# Patient Record
Sex: Female | Born: 1935 | Race: White | Hispanic: No | Smoking: Never smoker
Health system: Southern US, Community
[De-identification: ages and names within clinical notes are randomized; demographics above are authoritative.]

## PROBLEM LIST (undated history)

## (undated) DIAGNOSIS — E039 Hypothyroidism, unspecified: Secondary | ICD-10-CM

## (undated) DIAGNOSIS — E079 Disorder of thyroid, unspecified: Secondary | ICD-10-CM

## (undated) DIAGNOSIS — K5792 Diverticulitis of intestine, part unspecified, without perforation or abscess without bleeding: Secondary | ICD-10-CM

## (undated) DIAGNOSIS — I10 Essential (primary) hypertension: Secondary | ICD-10-CM

## (undated) DIAGNOSIS — K219 Gastro-esophageal reflux disease without esophagitis: Secondary | ICD-10-CM

## (undated) DIAGNOSIS — I639 Cerebral infarction, unspecified: Secondary | ICD-10-CM

## (undated) DIAGNOSIS — I509 Heart failure, unspecified: Secondary | ICD-10-CM

## (undated) DIAGNOSIS — I482 Chronic atrial fibrillation, unspecified: Secondary | ICD-10-CM

## (undated) HISTORY — PX: HIP FRACTURE SURGERY: SHX118

## (undated) HISTORY — PX: TONSILLECTOMY: SUR1361

## (undated) HISTORY — PX: APPENDECTOMY: SHX54

## (undated) HISTORY — PX: ABDOMINAL HYSTERECTOMY: SHX81

## (undated) HISTORY — PX: JOINT REPLACEMENT: SHX530

## (undated) HISTORY — PX: HERNIA REPAIR: SHX51

---

## 2011-04-10 ENCOUNTER — Emergency Department (HOSPITAL_BASED_OUTPATIENT_CLINIC_OR_DEPARTMENT_OTHER)
Admission: EM | Admit: 2011-04-10 | Discharge: 2011-04-10 | Disposition: A | Discharge status: Other Acute Inpatient Hospital | Payer: Medicare Other | Attending: Emergency Medicine | Admitting: Emergency Medicine

## 2011-04-10 ENCOUNTER — Emergency Department (INDEPENDENT_AMBULATORY_CARE_PROVIDER_SITE_OTHER): Payer: Medicare Other

## 2011-04-10 DIAGNOSIS — Z79899 Other long term (current) drug therapy: Secondary | ICD-10-CM | POA: Insufficient documentation

## 2011-04-10 DIAGNOSIS — K5730 Diverticulosis of large intestine without perforation or abscess without bleeding: Secondary | ICD-10-CM | POA: Insufficient documentation

## 2011-04-10 DIAGNOSIS — R109 Unspecified abdominal pain: Secondary | ICD-10-CM

## 2011-04-10 DIAGNOSIS — E039 Hypothyroidism, unspecified: Secondary | ICD-10-CM | POA: Insufficient documentation

## 2011-04-10 DIAGNOSIS — I10 Essential (primary) hypertension: Secondary | ICD-10-CM | POA: Insufficient documentation

## 2011-04-10 DIAGNOSIS — M47817 Spondylosis without myelopathy or radiculopathy, lumbosacral region: Secondary | ICD-10-CM

## 2011-04-10 DIAGNOSIS — M5137 Other intervertebral disc degeneration, lumbosacral region: Secondary | ICD-10-CM

## 2011-04-10 DIAGNOSIS — I4891 Unspecified atrial fibrillation: Secondary | ICD-10-CM | POA: Insufficient documentation

## 2011-04-10 LAB — URINALYSIS, ROUTINE W REFLEX MICROSCOPIC
Ketones, ur: NEGATIVE mg/dL
Nitrite: NEGATIVE
Protein, ur: NEGATIVE mg/dL
pH: 7.5 (ref 5.0–8.0)

## 2011-04-10 LAB — URINE MICROSCOPIC-ADD ON

## 2011-04-10 LAB — COMPREHENSIVE METABOLIC PANEL
CO2: 24 mEq/L (ref 19–32)
Calcium: 10.1 mg/dL (ref 8.4–10.5)
Creatinine, Ser: 1.3 mg/dL — ABNORMAL HIGH (ref 0.4–1.2)
GFR calc non Af Amer: 38 mL/min — ABNORMAL LOW (ref >60)
Glucose, Bld: 114 mg/dL — ABNORMAL HIGH (ref 70–99)

## 2011-04-10 LAB — POCT OCCULT BLOOD STOOL (DEVICE): Fecal Occult Bld: NEGATIVE

## 2011-04-10 LAB — DIFFERENTIAL
Eosinophils Absolute: 0.1 10*3/uL (ref 0.0–0.7)
Eosinophils Relative: 2 % (ref 0–5)
Lymphs Abs: 1.8 10*3/uL (ref 0.7–4.0)
Monocytes Relative: 11 % (ref 3–12)

## 2011-04-10 LAB — CBC
MCH: 31 pg (ref 26.0–34.0)
MCV: 92.7 fL (ref 78.0–100.0)
Platelets: 212 10*3/uL (ref 150–400)
RDW: 13.1 % (ref 11.5–15.5)

## 2011-04-10 MED ORDER — IOHEXOL 300 MG/ML  SOLN
100.0000 mL | Freq: Once | INTRAMUSCULAR | Status: AC | PRN
  Administered 2011-04-10: 100 mL via INTRAVENOUS

## 2012-04-09 ENCOUNTER — Emergency Department (HOSPITAL_BASED_OUTPATIENT_CLINIC_OR_DEPARTMENT_OTHER): Payer: Medicare Other

## 2012-04-09 ENCOUNTER — Emergency Department (HOSPITAL_BASED_OUTPATIENT_CLINIC_OR_DEPARTMENT_OTHER)
Admission: EM | Admit: 2012-04-09 | Discharge: 2012-04-09 | Disposition: A | Discharge status: Other Acute Inpatient Hospital | Payer: Medicare Other | Attending: Emergency Medicine | Admitting: Emergency Medicine

## 2012-04-09 ENCOUNTER — Encounter (HOSPITAL_BASED_OUTPATIENT_CLINIC_OR_DEPARTMENT_OTHER): Payer: Self-pay | Admitting: *Deleted

## 2012-04-09 DIAGNOSIS — E079 Disorder of thyroid, unspecified: Secondary | ICD-10-CM | POA: Insufficient documentation

## 2012-04-09 DIAGNOSIS — R1031 Right lower quadrant pain: Secondary | ICD-10-CM | POA: Insufficient documentation

## 2012-04-09 DIAGNOSIS — K219 Gastro-esophageal reflux disease without esophagitis: Secondary | ICD-10-CM | POA: Insufficient documentation

## 2012-04-09 DIAGNOSIS — I4891 Unspecified atrial fibrillation: Secondary | ICD-10-CM | POA: Insufficient documentation

## 2012-04-09 DIAGNOSIS — R110 Nausea: Secondary | ICD-10-CM | POA: Insufficient documentation

## 2012-04-09 DIAGNOSIS — I10 Essential (primary) hypertension: Secondary | ICD-10-CM | POA: Insufficient documentation

## 2012-04-09 DIAGNOSIS — I498 Other specified cardiac arrhythmias: Secondary | ICD-10-CM | POA: Insufficient documentation

## 2012-04-09 HISTORY — DX: Diverticulitis of intestine, part unspecified, without perforation or abscess without bleeding: K57.92

## 2012-04-09 HISTORY — DX: Gastro-esophageal reflux disease without esophagitis: K21.9

## 2012-04-09 HISTORY — DX: Essential (primary) hypertension: I10

## 2012-04-09 HISTORY — DX: Disorder of thyroid, unspecified: E07.9

## 2012-04-09 HISTORY — DX: Chronic atrial fibrillation, unspecified: I48.20

## 2012-04-09 LAB — COMPREHENSIVE METABOLIC PANEL
ALT: 16 U/L (ref 0–35)
AST: 21 U/L (ref 0–37)
Albumin: 3.8 g/dL (ref 3.5–5.2)
Alkaline Phosphatase: 76 U/L (ref 39–117)
BUN: 23 mg/dL (ref 6–23)
Chloride: 102 mEq/L (ref 96–112)
Potassium: 4.8 mEq/L (ref 3.5–5.1)
Sodium: 136 mEq/L (ref 135–145)
Total Bilirubin: 0.3 mg/dL (ref 0.3–1.2)

## 2012-04-09 LAB — CARDIAC PANEL(CRET KIN+CKTOT+MB+TROPI)
CK, MB: 3 ng/mL (ref 0.3–4.0)
Relative Index: INVALID (ref 0.0–2.5)
Total CK: 63 U/L (ref 7–177)
Troponin I: <0.3 ng/mL (ref <0.30)

## 2012-04-09 LAB — DIFFERENTIAL
Basophils Relative: 0 % (ref 0–1)
Monocytes Relative: 10 % (ref 3–12)
Neutro Abs: 6.3 10*3/uL (ref 1.7–7.7)
Neutrophils Relative %: 62 % (ref 43–77)

## 2012-04-09 LAB — CBC
Hemoglobin: 13 g/dL (ref 12.0–15.0)
MCHC: 34.3 g/dL (ref 30.0–36.0)
RBC: 4.05 MIL/uL (ref 3.87–5.11)

## 2012-04-09 LAB — URINALYSIS, ROUTINE W REFLEX MICROSCOPIC
Bilirubin Urine: NEGATIVE
Glucose, UA: NEGATIVE mg/dL
Hgb urine dipstick: NEGATIVE
Ketones, ur: NEGATIVE mg/dL
Protein, ur: NEGATIVE mg/dL

## 2012-04-09 MED ORDER — SODIUM CHLORIDE 0.9 % IV SOLN
INTRAVENOUS | Status: DC
  Administered 2012-04-09: 01:00:00 via INTRAVENOUS

## 2012-04-09 MED ORDER — IOHEXOL 300 MG/ML  SOLN
75.0000 mL | Freq: Once | INTRAMUSCULAR | Status: AC | PRN
  Administered 2012-04-09: 75 mL via INTRAVENOUS

## 2012-04-09 MED ORDER — ONDANSETRON HCL 4 MG/2ML IJ SOLN
INTRAMUSCULAR | Status: AC
  Filled 2012-04-09: qty 2

## 2012-04-09 MED ORDER — ONDANSETRON HCL 4 MG/2ML IJ SOLN
4.0000 mg | Freq: Once | INTRAMUSCULAR | Status: AC
  Administered 2012-04-09: 4 mg via INTRAVENOUS

## 2012-04-09 MED ORDER — SIMETHICONE 40 MG/0.6ML PO SUSP (UNIT DOSE)
80.0000 mg | Freq: Once | ORAL | Status: AC
  Administered 2012-04-09: 80 mg via ORAL
  Filled 2012-04-09: qty 1.2

## 2012-04-09 MED ORDER — SIMETHICONE 40 MG/0.6ML PO SUSP
ORAL | Status: AC
  Filled 2012-04-09: qty 1.2

## 2012-04-09 MED ORDER — ONDANSETRON HCL 4 MG/2ML IJ SOLN
INTRAMUSCULAR | Status: AC
  Administered 2012-04-09: 4 mg via INTRAVENOUS
  Filled 2012-04-09: qty 2

## 2012-04-09 MED ORDER — FENTANYL CITRATE 0.05 MG/ML IJ SOLN
50.0000 ug | Freq: Once | INTRAMUSCULAR | Status: AC
  Administered 2012-04-09: 50 ug via INTRAVENOUS
  Filled 2012-04-09: qty 2

## 2012-04-09 MED ORDER — FENTANYL CITRATE 0.05 MG/ML IJ SOLN
25.0000 ug | Freq: Once | INTRAMUSCULAR | Status: AC
  Administered 2012-04-09: 25 ug via INTRAVENOUS
  Filled 2012-04-09: qty 2

## 2012-04-09 MED ORDER — IOHEXOL 300 MG/ML  SOLN
20.0000 mL | INTRAMUSCULAR | Status: AC
  Administered 2012-04-09: 20 mL via ORAL

## 2012-04-09 MED ORDER — PROMETHAZINE HCL 25 MG/ML IJ SOLN
12.5000 mg | Freq: Once | INTRAMUSCULAR | Status: AC
  Administered 2012-04-09: 12.5 mg via INTRAVENOUS
  Filled 2012-04-09: qty 1

## 2012-04-09 NOTE — ED Notes (Signed)
Pt c/o returning pain. MD made aware, awaiting orders.

## 2012-04-09 NOTE — ED Notes (Signed)
MD at bedside. 

## 2012-04-09 NOTE — ED Provider Notes (Addendum)
History     CSN: [id number]  Arrival date & time 04/09/12  0041   First MD Initiated Contact with Patient 04/09/12 0122      Chief Complaint  Patient presents with  . Abdominal Pain    (Consider location/radiation/quality/duration/timing/severity/associated sxs/prior treatment) HPI This is a 78 year old white female with a history of diverticulitis. She had the sudden onset of right lower quadrant abdominal pain about 9 PM yesterday. The pain is moderate to severe and consistent with previous diverticulitis. It is less severe now than it was earlier. The pain is worse with movement or palpation. It has been associated with nausea but no vomiting. She has a history of chronic constipation with an acute change in her bowel habits. She's not aware of having any fever. She was given Zofran arrival with improvement in her nausea.  Past Medical History  Diagnosis Date  . Diverticulitis   . Atrial fibrillation, chronic   . Hypertension   . Thyroid disease   . GERD (gastroesophageal reflux disease)     Past Surgical History  Procedure Date  . Abdominal hysterectomy   . Tonsillectomy   . Appendectomy   . Joint replacement   . Hernia repair   . Cesarean section     History reviewed. No pertinent family history.  History  Substance Use Topics  . Smoking status: Never Smoker   . Smokeless tobacco: Not on file  . Alcohol Use: No    OB History    Grav Para Term Preterm Abortions TAB SAB Ect Mult Living                  Review of Systems  All other systems reviewed and are negative.    Allergies  Darvocet and Sulfa antibiotics  Home Medications  No current outpatient prescriptions on file.  BP 151/55  Pulse 62  Temp(Src) 97.7 F (36.5 C) (Oral)  Resp 16  Ht 5\' 1"  (1.549 m)  Wt 135 lb (61.236 kg)  BMI 25.51 kg/m2  SpO2 93%  LMP 04/09/2012  Physical Exam General: Well-developed, well-nourished female in no acute distress; appearance consistent with age of  record HENT: normocephalic, atraumatic Eyes: pupils equal round and reactive to light; extraocular muscles intact Neck: supple Heart: Irregular rhythm; distant sounds Lungs: clear to auscultation bilaterally Abdomen: soft; moderate to severe right upper quadrant tenderness; nondistended; bowel sounds present Extremities: No deformity; normal range of motion; +1 edema of lower leg Neurologic: Awake, alert and oriented; motor function intact in all extremities and symmetric; no facial droop Skin: Warm and dry    ED Course  Procedures (including critical care time)     MDM   Nursing notes and vitals signs, including pulse oximetry, reviewed.  Summary of this visit's results, reviewed by myself:  Labs:  Results for orders placed during the hospital encounter of 04/09/12  CBC      Component Value Range   WBC 10.3  4.0 - 10.5 (K/uL)   RBC 4.05  3.87 - 5.11 (MIL/uL)   Hemoglobin 13.0  12.0 - 15.0 (g/dL)   HCT 40.9  81.1 - 91.4 (%)   MCV 93.6  78.0 - 100.0 (fL)   MCH 32.1  26.0 - 34.0 (pg)   MCHC 34.3  30.0 - 36.0 (g/dL)   RDW 78.2  95.6 - 21.3 (%)   Platelets 197  150 - 400 (K/uL)  DIFFERENTIAL      Component Value Range   Neutrophils Relative 62  43 - 77 (%)  Neutro Abs 6.3  1.7 - 7.7 (K/uL)   Lymphocytes Relative 27  12 - 46 (%)   Lymphs Abs 2.7  0.7 - 4.0 (K/uL)   Monocytes Relative 10  3 - 12 (%)   Monocytes Absolute 1.0  0.1 - 1.0 (K/uL)   Eosinophils Relative 2  0 - 5 (%)   Eosinophils Absolute 0.2  0.0 - 0.7 (K/uL)   Basophils Relative 0  0 - 1 (%)   Basophils Absolute 0.0  0.0 - 0.1 (K/uL)  COMPREHENSIVE METABOLIC PANEL      Component Value Range   Sodium 136  135 - 145 (mEq/L)   Potassium 4.8  3.5 - 5.1 (mEq/L)   Chloride 102  96 - 112 (mEq/L)   CO2 27  19 - 32 (mEq/L)   Glucose, Bld 127 (*) 70 - 99 (mg/dL)   BUN 23  6 - 23 (mg/dL)   Creatinine, Ser 1.61 (*) 0.50 - 1.10 (mg/dL)   Calcium 09.6 (*) 8.4 - 10.5 (mg/dL)   Total Protein 7.1  6.0 - 8.3 (g/dL)     Albumin 3.8  3.5 - 5.2 (g/dL)   AST 21  0 - 37 (U/L)   ALT 16  0 - 35 (U/L)   Alkaline Phosphatase 76  39 - 117 (U/L)   Total Bilirubin 0.3  0.3 - 1.2 (mg/dL)   GFR calc non Af Amer 34 (*) >90 (mL/min)   GFR calc Af Amer 39 (*) >90 (mL/min)  URINALYSIS, ROUTINE W REFLEX MICROSCOPIC      Component Value Range   Color, Urine YELLOW  YELLOW    APPearance CLEAR  CLEAR    Specific Gravity, Urine 1.011  1.005 - 1.030    pH 7.5  5.0 - 8.0    Glucose, UA NEGATIVE  NEGATIVE (mg/dL)   Hgb urine dipstick NEGATIVE  NEGATIVE    Bilirubin Urine NEGATIVE  NEGATIVE    Ketones, ur NEGATIVE  NEGATIVE (mg/dL)   Protein, ur NEGATIVE  NEGATIVE (mg/dL)   Urobilinogen, UA 0.2  0.0 - 1.0 (mg/dL)   Nitrite NEGATIVE  NEGATIVE    Leukocytes, UA NEGATIVE  NEGATIVE   LACTIC ACID, PLASMA      Component Value Range   Lactic Acid, Venous 0.6  0.5 - 2.2 (mmol/L)  LIPASE, BLOOD      Component Value Range   Lipase 20  11 - 59 (U/L)  CARDIAC PANEL(CRET KIN+CKTOT+MB+TROPI)      Component Value Range   Total CK 63  7 - 177 (U/L)   CK, MB 3.0  0.3 - 4.0 (ng/mL)   Troponin I <0.30  <0.30 (ng/mL)   Relative Index RELATIVE INDEX IS INVALID  0.0 - 2.5     Imaging Studies: Ct Abdomen Pelvis W Contrast  04/09/2012  *RADIOLOGY REPORT*  Clinical Data: Abdominal pain, nausea and vomiting.  White cell count 10.3.  CT ABDOMEN AND PELVIS WITH CONTRAST  Technique:  Multidetector CT imaging of the abdomen and pelvis was performed following the standard protocol during bolus administration of intravenous contrast.  Contrast: 75mL OMNIPAQUE IOHEXOL 300 MG/ML  SOLN .  Reduced contrast dose given due to GFR 35.  Comparison: 04/10/2011  Findings: Fibrosis and atelectasis in the lung bases.  Coronary artery calcification.  Postoperative changes with cholecystectomy and sutures or mesh in the right anterior abdominal wall.  Intra and extrahepatic bile duct dilatation, similar to previous study, and likely due to postoperative  physiology.  The pancreas is atrophic.  No adrenal gland  nodules.  Cyst in the left kidney is stable.  No hydronephrosis in either kidney.  Mild renal parenchymal atrophy with symmetrical nephrograms.  Extensive calcification of the abdominal aorta and branch vessels.  Flow is demonstrated in the mesenteric and portal vessels.  The spleen size is normal.  No retroperitoneal lymphadenopathy.  No free fluid or free air in the abdomen.  Small anterior abdominal wall hernia along the midline and containing fat.  The gastric wall is not thickened.  Small bowel are decompressed.  Stool filled colon without distension.  Pelvis:  There is a right inguinal hernia containing what appears to be ascending colon and terminal ileum.  The appendix is not visualized.  No bladder wall thickening.  Uterus and adnexal structures are not visualized and may be surgically absent or atrophic.  Large amount of stool in the rectum and sigmoid colon. Diverticulosis of the sigmoid colon.  No evidence of diverticulitis.  Mild mesenteric edema is suggested.  Small left inguinal hernia containing fluid.  Spondylolysis and spondylolisthesis of L4 on L5.  Mild anterior subluxation of L3 on L4 is likely degenerative.  Degenerative changes throughout the lumbar spine.  Compression deformities of T11 and T12 vertebra. Bone changes are stable since the previous study.  IMPRESSION: Right inguinal hernia containing colon without obstruction. Diffusely stool filled colon suggesting constipation.  No acute inflammatory process.  Original Report Authenticated By: Marlon Pel, M.D.   3:26 AM Patient sleeping comfortably. The patient still has pain and tenderness in the right upper quadrant. There is no tenderness in the right inguinal region.  4:00 AM Patient had large bowel movements and still complaining of pain.  6:05 AM Patient states she is in too much pain to get out of bed. We will have her admitted to [facility].  Patient was accepted by Dr. Pearson Grippe.         Hanley Seamen, MD 04/09/12 [id number]  Hanley Seamen, MD 04/09/12 [phone]

## 2012-04-09 NOTE — ED Notes (Signed)
Pt c/o abd pain x 3 hrs hx diverticulitis

## 2012-04-09 NOTE — ED Notes (Signed)
Pt is finished with oral contrast 

## 2012-04-09 NOTE — ED Notes (Signed)
Report received from charge nurse Vickie. Pt care assumed, pt first seen by this rn.

## 2012-04-09 NOTE — ED Notes (Signed)
Patient used bedpan; very small loose BM.

## 2013-08-13 ENCOUNTER — Emergency Department (HOSPITAL_BASED_OUTPATIENT_CLINIC_OR_DEPARTMENT_OTHER): Payer: Medicare Other

## 2013-08-13 ENCOUNTER — Emergency Department (HOSPITAL_BASED_OUTPATIENT_CLINIC_OR_DEPARTMENT_OTHER)
Admission: EM | Admit: 2013-08-13 | Discharge: 2013-08-13 | Disposition: A | Discharge status: Home / Self Care | Payer: Medicare Other | Attending: Emergency Medicine | Admitting: Emergency Medicine

## 2013-08-13 ENCOUNTER — Encounter (HOSPITAL_BASED_OUTPATIENT_CLINIC_OR_DEPARTMENT_OTHER): Payer: Self-pay | Admitting: Student

## 2013-08-13 DIAGNOSIS — R42 Dizziness and giddiness: Secondary | ICD-10-CM

## 2013-08-13 DIAGNOSIS — I10 Essential (primary) hypertension: Secondary | ICD-10-CM | POA: Insufficient documentation

## 2013-08-13 DIAGNOSIS — K219 Gastro-esophageal reflux disease without esophagitis: Secondary | ICD-10-CM | POA: Insufficient documentation

## 2013-08-13 DIAGNOSIS — Z7901 Long term (current) use of anticoagulants: Secondary | ICD-10-CM | POA: Insufficient documentation

## 2013-08-13 DIAGNOSIS — Z79899 Other long term (current) drug therapy: Secondary | ICD-10-CM | POA: Insufficient documentation

## 2013-08-13 DIAGNOSIS — R918 Other nonspecific abnormal finding of lung field: Secondary | ICD-10-CM

## 2013-08-13 DIAGNOSIS — E079 Disorder of thyroid, unspecified: Secondary | ICD-10-CM | POA: Insufficient documentation

## 2013-08-13 DIAGNOSIS — R222 Localized swelling, mass and lump, trunk: Secondary | ICD-10-CM | POA: Insufficient documentation

## 2013-08-13 LAB — URINALYSIS, ROUTINE W REFLEX MICROSCOPIC
Bilirubin Urine: NEGATIVE
Ketones, ur: NEGATIVE mg/dL
Leukocytes, UA: NEGATIVE
Nitrite: NEGATIVE
Urobilinogen, UA: 0.2 mg/dL (ref 0.0–1.0)
pH: 7.5 (ref 5.0–8.0)

## 2013-08-13 LAB — BASIC METABOLIC PANEL
BUN: 33 mg/dL — ABNORMAL HIGH (ref 6–23)
Calcium: 11.4 mg/dL — ABNORMAL HIGH (ref 8.4–10.5)
Creatinine, Ser: 1.2 mg/dL — ABNORMAL HIGH (ref 0.50–1.10)
GFR calc Af Amer: 43 mL/min — ABNORMAL LOW (ref >90)
GFR calc non Af Amer: 37 mL/min — ABNORMAL LOW (ref >90)

## 2013-08-13 LAB — TROPONIN I: Troponin I: <0.3 ng/mL (ref <0.30)

## 2013-08-13 LAB — CBC
HCT: 35.4 % — ABNORMAL LOW (ref 36.0–46.0)
MCH: 31.4 pg (ref 26.0–34.0)
MCHC: 32.8 g/dL (ref 30.0–36.0)
MCV: 95.9 fL (ref 78.0–100.0)
RDW: 12.6 % (ref 11.5–15.5)

## 2013-08-13 MED ORDER — SODIUM CHLORIDE 0.9 % IV BOLUS (SEPSIS)
500.0000 mL | Freq: Once | INTRAVENOUS | Status: AC
  Administered 2013-08-13: 500 mL via INTRAVENOUS

## 2013-08-13 NOTE — ED Provider Notes (Signed)
CSN: [id number]     Arrival date & time 08/13/13  5784 History   First MD Initiated Contact with Patient 08/13/13 0945     Chief Complaint  Patient presents with  . Atrial Fibrillation   (Consider location/radiation/quality/duration/timing/severity/associated sxs/prior Treatment) HPI Comments: Dizziness intermittently since yesterday. Can't describe quality dizziness. Denies CP, SOB, fever, vomiting. Better with lying down, worse with standing up. States hx of chronic balance problems and ambulate with a walker intermittently. No hx of stroke, TIA. Patient's daughter states she got up, walked around all morning and did not seem off balance.  Patient is a 79 y.o. female presenting with atrial fibrillation and neurologic complaint. The history is provided by the patient.  Atrial Fibrillation This is a chronic problem. Pertinent negatives include no chest pain, no abdominal pain and no shortness of breath.  Neurologic Problem This is a new problem. The current episode started yesterday. The problem occurs daily. The problem has been gradually improving. Pertinent negatives include no chest pain, no abdominal pain and no shortness of breath. Exacerbated by: standing up. Relieved by: lying down. She has tried nothing for the symptoms. The treatment provided no relief.    Past Medical History  Diagnosis Date  . Diverticulitis   . Atrial fibrillation, chronic   . Hypertension   . Thyroid disease   . GERD (gastroesophageal reflux disease)    Past Surgical History  Procedure Laterality Date  . Abdominal hysterectomy    . Tonsillectomy    . Appendectomy    . Joint replacement    . Hernia repair    . Cesarean section     History reviewed. No pertinent family history. History  Substance Use Topics  . Smoking status: Never Smoker   . Smokeless tobacco: Not on file  . Alcohol Use: No   OB History   Grav Para Term Preterm Abortions TAB SAB Ect Mult Living                 Review of  Systems  Constitutional: Negative for fever and chills.  Eyes: Negative for visual disturbance.  Respiratory: Positive for cough (occasional). Negative for shortness of breath.   Cardiovascular: Negative for chest pain and leg swelling.  Gastrointestinal: Negative for vomiting, abdominal pain and diarrhea.  Genitourinary: Negative for dysuria, frequency and flank pain.  Musculoskeletal: Positive for gait problem (chronic problems with her balance).  All other systems reviewed and are negative.    Allergies  Darvocet and Sulfa antibiotics  Home Medications   Current Outpatient Rx  Name  Route  Sig  Dispense  Refill  . acetaminophen (TYLENOL) 500 MG tablet   Oral   Take 500 mg by mouth every 6 (six) hours as needed for pain.         . carvedilol (COREG) 12.5 MG tablet   Oral   Take 12.5 mg by mouth 2 (two) times daily with a meal.         . furosemide (LASIX) 40 MG tablet   Oral   Take 40 mg by mouth.         . levothyroxine (SYNTHROID, LEVOTHROID) 75 MCG tablet   Oral   Take 75 mcg by mouth daily before breakfast.         . losartan (COZAAR) 50 MG tablet   Oral   Take 50 mg by mouth daily.         Marland Kitchen omeprazole (PRILOSEC) 20 MG capsule   Oral   Take 20 mg by  mouth daily.         . Rivaroxaban (XARELTO) 15 MG TABS tablet   Oral   Take 15 mg by mouth 2 (two) times daily with a meal.          BP 151/89  Pulse 62  Temp(Src) 97.8 F (36.6 C) (Oral)  Resp 18  Wt 122 lb 8 oz (55.566 kg)  BMI 23.16 kg/m2  SpO2 97%  LMP 04/09/2012 Physical Exam  Nursing note and vitals reviewed. Constitutional: She is oriented to person, place, and time. She appears well-developed and well-nourished. No distress.  HENT:  Head: Normocephalic and atraumatic.  Eyes: EOM are normal. Pupils are equal, round, and reactive to light.  Neck: Normal range of motion. Neck supple.  Cardiovascular: Normal rate and regular rhythm.  Exam reveals no friction rub.   No murmur  heard. Pulmonary/Chest: Effort normal and breath sounds normal. No respiratory distress. She has no wheezes. She has no rales.  Abdominal: Soft. She exhibits no distension. There is no tenderness. There is no rebound.  Musculoskeletal: Normal range of motion. She exhibits no edema.  Neurological: She is alert and oriented to person, place, and time. No cranial nerve deficit. She exhibits normal muscle tone. Coordination normal.  Normal gait  Skin: No rash noted. She is not diaphoretic.    ED Course  Procedures (including critical care time) Labs Review Labs Reviewed - No data to display Imaging Review No results found.   Date: 08/13/2013  Rate: 64  Rhythm: atrial fibrillation  QRS Axis: normal  Intervals: normal  ST/T Wave abnormalities: normal  Conduction Disutrbances:none  Narrative Interpretation:   Old EKG Reviewed: prior EKG with sinus rhythm and 1st degree AV block.   MDM   1. Dizziness   2. Lung mass    66 are old female with history of chronic A. fib on xarelto, diverticulitis, hypertension presents with mild dizziness. She states an intermittent dizziness when she stands up to move around since yesterday. She states is improving. She had called her doctor this morning to ask if she could get her blood pressure checked, because she thought her dizziness was due to her elevated blood pressure, and that her doctor told her to get checked out in the ER after she mentioned an intermittent history of arm pain. And arm pain comes once every 2-3 weeks, lasts just a few seconds and starts in her left arm. She denies any chest pain, shortness of breath, fevers, abdominal pain, nausea, vomiting, diarrhea, dysuria. She states an occasional mild cough. She is a little distressed because she just wanted to get her blood pressure checked, and now she's here in the emergency department. As far as her dizziness, she states it is improving. She states she has history of balance trouble and  asthma she uses a walker. She feels like she needs use a walker to help not to have her lose her balance. She states she's had vertigo previously, and this does not feel like vertigo. On exam today, her vitals are stable. She is well-appearing and relaxing comfortably in no acute distress. She is not dizzy here. She has normal cranial nerve exam, normal coordination, normal strength and sensation in all extremities. She has normal gait. She got out of bed unassisted and walk without any dizziness. Upon standing, she did not have any dizziness. Her initial EKG shows A. fib, which she has a chronic history of. I noted no other EKG showing A. fib, however A. fib as listed  in her chart as chronic and she is on chronic anticoagulation. Patient appears her well and does not appear to be in any acute distress. I do not feel her dizziness is related to a vertiginous cause. I feel like it is likely orthostatic with that coming upon sAndtanding. She cannot describe it as swimming versus spinning, however I feel like it is likely due to orthostasis. She reports decreased by mouth intake chronically due to not cooking as much, but no acute decrease in her by mouth intake. I will check basic labs, urine studies, CXR, Head CT.  Labs normal. 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 No signs of acute infarct. Patient states bouts of a long time, this in part could benefit years. Patient's chest x-ray shows left lung mass. Overlies the right ventricle. An informed patient about these findings. 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 I do not feel that this is unreasonable. I instructed her to followup with her PCP for her lung mass in her stroke workup. She is currently anticoagulated consult so, so do not feel any discern aspirin. I spoke with the PA at her primary doctor's practice and informed her of these findings and she will contact the patient next week. Patient stable for discharge.  Dagmar Hait, MD 08/13/13 [phone]

## 2013-08-13 NOTE — ED Notes (Signed)
MD at bedside. 

## 2013-08-13 NOTE — ED Notes (Signed)
Pt in with AFIB, reports being checked out by EMS at scene and referred to hospital. Pt has HX of AFIB. Pt reports being dizzy since yesterday afternoon. Denies N V D CP LOC SOB.

## 2013-12-06 ENCOUNTER — Emergency Department (HOSPITAL_BASED_OUTPATIENT_CLINIC_OR_DEPARTMENT_OTHER)
Admission: EM | Admit: 2013-12-06 | Discharge: 2013-12-06 | Disposition: A | Discharge status: Home / Self Care | Payer: Medicare Other | Attending: Emergency Medicine | Admitting: Emergency Medicine

## 2013-12-06 ENCOUNTER — Emergency Department (HOSPITAL_BASED_OUTPATIENT_CLINIC_OR_DEPARTMENT_OTHER): Payer: Medicare Other

## 2013-12-06 ENCOUNTER — Encounter (HOSPITAL_BASED_OUTPATIENT_CLINIC_OR_DEPARTMENT_OTHER): Payer: Self-pay | Admitting: Emergency Medicine

## 2013-12-06 DIAGNOSIS — I4891 Unspecified atrial fibrillation: Secondary | ICD-10-CM | POA: Insufficient documentation

## 2013-12-06 DIAGNOSIS — Z7901 Long term (current) use of anticoagulants: Secondary | ICD-10-CM | POA: Insufficient documentation

## 2013-12-06 DIAGNOSIS — E039 Hypothyroidism, unspecified: Secondary | ICD-10-CM | POA: Insufficient documentation

## 2013-12-06 DIAGNOSIS — K219 Gastro-esophageal reflux disease without esophagitis: Secondary | ICD-10-CM | POA: Insufficient documentation

## 2013-12-06 DIAGNOSIS — M79606 Pain in leg, unspecified: Secondary | ICD-10-CM

## 2013-12-06 DIAGNOSIS — M79609 Pain in unspecified limb: Secondary | ICD-10-CM | POA: Insufficient documentation

## 2013-12-06 DIAGNOSIS — Z79899 Other long term (current) drug therapy: Secondary | ICD-10-CM | POA: Insufficient documentation

## 2013-12-06 DIAGNOSIS — I10 Essential (primary) hypertension: Secondary | ICD-10-CM | POA: Insufficient documentation

## 2013-12-06 DIAGNOSIS — M7989 Other specified soft tissue disorders: Secondary | ICD-10-CM | POA: Insufficient documentation

## 2013-12-06 HISTORY — DX: Hypothyroidism, unspecified: E03.9

## 2013-12-06 NOTE — ED Provider Notes (Signed)
CSN: [id number]     Arrival date & time 12/06/13  4098 History   First MD Initiated Contact with Patient 12/06/13 [phone]     Chief Complaint  Patient presents with  . Leg Pain    left lower leg   (Consider location/radiation/quality/duration/timing/severity/associated sxs/prior Treatment) HPI Comments: Patient presents with left leg pain. She has a history of atrial fibrillation and has been off her several 2 for 3 days because she's having a tooth pulled today. She started having some intermittent pains in her left leg last night, it was little worse this morning. She noticed some puffiness to her leg earlier today but the leg is back to normal now. She denies any chest pain or shortness of breath. She denies any history of DVT in the past.  Patient is a 80 y.o. female presenting with leg pain.  Leg Pain Associated symptoms: no back pain, no fatigue and no fever     Past Medical History  Diagnosis Date  . Diverticulitis   . Atrial fibrillation, chronic   . Hypertension   . Thyroid disease   . GERD (gastroesophageal reflux disease)   . Hypothyroidism    Past Surgical History  Procedure Laterality Date  . Abdominal hysterectomy    . Tonsillectomy    . Appendectomy    . Joint replacement    . Hernia repair    . Cesarean section     History reviewed. No pertinent family history. History  Substance Use Topics  . Smoking status: Never Smoker   . Smokeless tobacco: Not on file  . Alcohol Use: No   OB History   Grav Para Term Preterm Abortions TAB SAB Ect Mult Living                 Review of Systems  Constitutional: Negative for fever, chills, diaphoresis and fatigue.  HENT: Negative for congestion, rhinorrhea and sneezing.   Eyes: Negative.   Respiratory: Negative for cough, chest tightness and shortness of breath.   Cardiovascular: Positive for leg swelling. Negative for chest pain.  Gastrointestinal: Negative for nausea, vomiting, abdominal pain, diarrhea and blood in  stool.  Genitourinary: Negative for frequency, hematuria, flank pain and difficulty urinating.  Musculoskeletal: Positive for myalgias. Negative for arthralgias and back pain.  Skin: Negative for rash.  Neurological: Negative for dizziness, speech difficulty, weakness, numbness and headaches.    Allergies  Darvocet and Sulfa antibiotics  Home Medications   Current Outpatient Rx  Name  Route  Sig  Dispense  Refill  . acetaminophen (TYLENOL) 500 MG tablet   Oral   Take 500 mg by mouth every 6 (six) hours as needed for pain.         . carvedilol (COREG) 12.5 MG tablet   Oral   Take 12.5 mg by mouth 2 (two) times daily with a meal.         . furosemide (LASIX) 40 MG tablet   Oral   Take 40 mg by mouth.         . levothyroxine (SYNTHROID, LEVOTHROID) 75 MCG tablet   Oral   Take 75 mcg by mouth daily before breakfast.         . losartan (COZAAR) 50 MG tablet   Oral   Take 50 mg by mouth daily.         Marland Kitchen omeprazole (PRILOSEC) 20 MG capsule   Oral   Take 20 mg by mouth daily.         . Rivaroxaban (  XARELTO) 15 MG TABS tablet   Oral   Take 15 mg by mouth 2 (two) times daily with a meal.          BP 155/49  Pulse 53  Temp(Src) 97.7 F (36.5 C)  Resp 16  Ht 5\' 1"  (1.549 m)  Wt 120 lb (54.432 kg)  BMI 22.69 kg/m2  SpO2 98%  LMP 04/09/2012 Physical Exam  Constitutional: She is oriented to person, place, and time. She appears well-developed and well-nourished.  HENT:  Head: Normocephalic and atraumatic.  Eyes: Pupils are equal, round, and reactive to light.  Neck: Normal range of motion. Neck supple.  Cardiovascular: Normal rate and normal heart sounds.  An irregular rhythm present.  Pulmonary/Chest: Effort normal and breath sounds normal. No respiratory distress. She has no wheezes. She has no rales. She exhibits no tenderness.  Abdominal: Soft. Bowel sounds are normal. There is no tenderness. There is no rebound and no guarding.  Musculoskeletal: Normal  range of motion. She exhibits no edema.  Patient has some tenderness to the medial aspect of her left lower leg distally. There is no pain to the posterior calf. There is no appreciable edema in the left as compared to the right. She has normal color in sensation to light. I cannot palpate either dorsalis pedis pulse. Using a Doppler ultrasound, she has strong dopplerable posterior tibial pulses bilaterally. Unable to Doppler either dorsalis pedis pulse. However she does have good color and warmth to both feet with no signs of acute vascular compromise.  Lymphadenopathy:    She has no cervical adenopathy.  Neurological: She is alert and oriented to person, place, and time.  Skin: Skin is warm and dry. No rash noted.  Psychiatric: She has a normal mood and affect.    ED Course  Procedures (including critical care time) Labs Review Labs Reviewed - No data to display Imaging Review [place] Venous Img Lower Unilateral Left  12/06/2013   CLINICAL DATA:  Left leg pain and swelling.  EXAM: LEFT LOWER EXTREMITY VENOUS DOPPLER ULTRASOUND  TECHNIQUE: Gray-scale sonography with graded compression, as well as color Doppler and duplex ultrasound, were performed to evaluate the deep venous system from the level of the common femoral vein through the popliteal and proximal calf veins. Spectral Doppler was utilized to evaluate flow at rest and with distal augmentation maneuvers.  COMPARISON:  None.  FINDINGS: Thrombus within deep veins:  None visualized.  Compressibility of deep veins:  Normal.  Duplex waveform respiratory phasicity:  Normal.  Duplex waveform response to augmentation:  Normal.  Venous reflux:  None visualized.  Other findings:  None visualized.  IMPRESSION: No deep venous thrombosis of the left lower extremity.   Electronically Signed   By: Elige KoHetal  Patel   On: 12/06/2013 11:39    EKG Interpretation   None       MDM   1. Leg pain    Patient has no evidence of DVT. She has no clinical findings  suggestive of acute arterial insufficiency. She's point tender in one area of her leg and I feel this is likely musculoskeletal in nature. I did encourage her to followup with her primary care physician for reevaluation and if she has any claudication symptoms may need to see a vascular surgeon.    Rolan BuccoMelanie Anaria Kroner, MD 12/06/13 [phone]

## 2013-12-06 NOTE — ED Notes (Signed)
Pt in with c/o left lower leg pain at left upper inner ankle area x several day. Reports being off Xarelto x 3 days r/t dental appt today. Reports increased pain with elevation of leg and while leg is at rest.

## 2013-12-06 NOTE — ED Notes (Signed)
MD at bedside. 

## 2013-12-06 NOTE — Discharge Instructions (Signed)
Musculoskeletal Pain °Musculoskeletal pain is muscle and boney aches and pains. These pains can occur in any part of the body. Your caregiver may treat you without knowing the cause of the pain. They may treat you if blood or urine tests, X-rays, and other tests were normal.  °CAUSES °There is often not a definite cause or reason for these pains. These pains may be caused by a type of germ (virus). The discomfort may also come from overuse. Overuse includes working out too hard when your body is not fit. Boney aches also come from weather changes. Bone is sensitive to atmospheric pressure changes. °HOME CARE INSTRUCTIONS  °· Ask when your test results will be ready. Make sure you get your test results. °· Only take over-the-counter or prescription medicines for pain, discomfort, or fever as directed by your caregiver. If you were given medications for your condition, do not drive, operate machinery or power tools, or sign legal documents for 24 hours. Do not drink alcohol. Do not take sleeping pills or other medications that may interfere with treatment. °· Continue all activities unless the activities cause more pain. When the pain lessens, slowly resume normal activities. Gradually increase the intensity and duration of the activities or exercise. °· During periods of severe pain, bed rest may be helpful. Lay or sit in any position that is comfortable. °· Putting ice on the injured area. °· Put ice in a bag. °· Place a towel between your skin and the bag. °· Leave the ice on for 15 to 20 minutes, 3 to 4 times a day. °· Follow up with your caregiver for continued problems and no reason can be found for the pain. If the pain becomes worse or does not go away, it may be necessary to repeat tests or do additional testing. Your caregiver may need to look further for a possible cause. °SEEK IMMEDIATE MEDICAL CARE IF: °· You have pain that is getting worse and is not relieved by medications. °· You develop chest pain  that is associated with shortness or breath, sweating, feeling sick to your stomach (nauseous), or throw up (vomit). °· Your pain becomes localized to the abdomen. °· You develop any new symptoms that seem different or that concern you. °MAKE SURE YOU:  °· Understand these instructions. °· Will watch your condition. °· Will get help right away if you are not doing well or get worse. °Document Released: 10/28/2005 Document Revised: 01/20/2012 Document Reviewed: 07/02/2013 °ExitCare® Patient Information ©2014 [employer]. ° °

## 2014-08-08 ENCOUNTER — Emergency Department (HOSPITAL_BASED_OUTPATIENT_CLINIC_OR_DEPARTMENT_OTHER)
Admission: EM | Admit: 2014-08-08 | Discharge: 2014-08-08 | Disposition: A | Discharge status: Other Acute Inpatient Hospital | Payer: Medicare Other | Attending: Emergency Medicine | Admitting: Emergency Medicine

## 2014-08-08 ENCOUNTER — Emergency Department (HOSPITAL_BASED_OUTPATIENT_CLINIC_OR_DEPARTMENT_OTHER): Payer: Medicare Other

## 2014-08-08 ENCOUNTER — Encounter (HOSPITAL_BASED_OUTPATIENT_CLINIC_OR_DEPARTMENT_OTHER): Payer: Self-pay | Admitting: Emergency Medicine

## 2014-08-08 DIAGNOSIS — R059 Cough, unspecified: Secondary | ICD-10-CM | POA: Insufficient documentation

## 2014-08-08 DIAGNOSIS — E079 Disorder of thyroid, unspecified: Secondary | ICD-10-CM | POA: Diagnosis not present

## 2014-08-08 DIAGNOSIS — K219 Gastro-esophageal reflux disease without esophagitis: Secondary | ICD-10-CM | POA: Insufficient documentation

## 2014-08-08 DIAGNOSIS — F17200 Nicotine dependence, unspecified, uncomplicated: Secondary | ICD-10-CM | POA: Diagnosis not present

## 2014-08-08 DIAGNOSIS — I509 Heart failure, unspecified: Secondary | ICD-10-CM | POA: Diagnosis not present

## 2014-08-08 DIAGNOSIS — Z7901 Long term (current) use of anticoagulants: Secondary | ICD-10-CM | POA: Insufficient documentation

## 2014-08-08 DIAGNOSIS — I4891 Unspecified atrial fibrillation: Secondary | ICD-10-CM | POA: Diagnosis not present

## 2014-08-08 DIAGNOSIS — I10 Essential (primary) hypertension: Secondary | ICD-10-CM | POA: Diagnosis not present

## 2014-08-08 DIAGNOSIS — R05 Cough: Secondary | ICD-10-CM | POA: Insufficient documentation

## 2014-08-08 DIAGNOSIS — Z79899 Other long term (current) drug therapy: Secondary | ICD-10-CM | POA: Diagnosis not present

## 2014-08-08 LAB — CBC
HCT: 37.2 % (ref 36.0–46.0)
HEMOGLOBIN: 12.1 g/dL (ref 12.0–15.0)
MCH: 31.8 pg (ref 26.0–34.0)
MCHC: 32.5 g/dL (ref 30.0–36.0)
MCV: 97.9 fL (ref 78.0–100.0)
PLATELETS: 209 10*3/uL (ref 150–400)
RBC: 3.8 MIL/uL — AB (ref 3.87–5.11)
RDW: 13 % (ref 11.5–15.5)
WBC: 6.7 10*3/uL (ref 4.0–10.5)

## 2014-08-08 LAB — TROPONIN I: Troponin I: <0.3 ng/mL (ref <0.30)

## 2014-08-08 LAB — BASIC METABOLIC PANEL
ANION GAP: 12 (ref 5–15)
BUN: 19 mg/dL (ref 6–23)
CALCIUM: 11.2 mg/dL — AB (ref 8.4–10.5)
CO2: 29 meq/L (ref 19–32)
Chloride: 99 mEq/L (ref 96–112)
Creatinine, Ser: 1 mg/dL (ref 0.50–1.10)
GFR calc Af Amer: 53 mL/min — ABNORMAL LOW (ref >90)
GFR, EST NON AFRICAN AMERICAN: 46 mL/min — AB (ref >90)
GLUCOSE: 95 mg/dL (ref 70–99)
Potassium: 4.2 mEq/L (ref 3.7–5.3)
Sodium: 140 mEq/L (ref 137–147)

## 2014-08-08 LAB — PRO B NATRIURETIC PEPTIDE: PRO B NATRI PEPTIDE: 4943 pg/mL — AB (ref 0–450)

## 2014-08-08 MED ORDER — ALBUTEROL SULFATE HFA 108 (90 BASE) MCG/ACT IN AERS
2.0000 | INHALATION_SPRAY | RESPIRATORY_TRACT | Status: DC | PRN
  Administered 2014-08-08: 2 via RESPIRATORY_TRACT
  Filled 2014-08-08: qty 6.7

## 2014-08-08 MED ORDER — FUROSEMIDE 10 MG/ML IJ SOLN
40.0000 mg | Freq: Once | INTRAMUSCULAR | Status: AC
  Administered 2014-08-08: 40 mg via INTRAVENOUS
  Filled 2014-08-08: qty 4

## 2014-08-08 NOTE — ED Notes (Signed)
[facility] is coming to transport patient to [facility] to [place], truck has been dispatched

## 2014-08-08 NOTE — ED Notes (Signed)
Patient transported to X-ray 

## 2014-08-08 NOTE — ED Notes (Signed)
Cough x6 days.  ABX  x5 days without office visit.  No better so called pmd office again this morning.  Sent here for eval.

## 2014-08-08 NOTE — ED Notes (Signed)
Pt leaving facility with [facility] at this time.

## 2014-08-08 NOTE — ED Notes (Signed)
MD at bedside. 

## 2014-08-08 NOTE — ED Provider Notes (Signed)
CSN: [id number]     Arrival date & time 08/08/14  5621 History   First MD Initiated Contact with Patient 08/08/14 [phone]     Chief Complaint  Patient presents with  . Cough     (Consider location/radiation/quality/duration/timing/severity/associated sxs/prior Treatment) HPI Pt presenting with c/o cough.  She states symptoms have been ongoing for the past 7 days.  She finished a course of azithromycin called in over the phone by her doctor, last dose last night.  No fever/chills.  No chest pain.  Was up all last night coughing and is tired this morning.  No change in her baseline lower extremity swelling.  No vomiting or diarrhea.  Cough is productive.  She has continued to eat and drink normallly.  There are no other associated systemic symptoms, there are no other alleviating or modifying factors.   Past Medical History  Diagnosis Date  . Diverticulitis   . Atrial fibrillation, chronic   . Hypertension   . Thyroid disease   . GERD (gastroesophageal reflux disease)   . Hypothyroidism    Past Surgical History  Procedure Laterality Date  . Abdominal hysterectomy    . Tonsillectomy    . Appendectomy    . Joint replacement    . Hernia repair    . Cesarean section    . Hip fracture surgery     No family history on file. History  Substance Use Topics  . Smoking status: Never Smoker   . Smokeless tobacco: Not on file  . Alcohol Use: No   OB History   Grav Para Term Preterm Abortions TAB SAB Ect Mult Living                 Review of Systems ROS reviewed and all otherwise negative except for mentioned in HPI    Allergies  Darvocet and Sulfa antibiotics  Home Medications   Prior to Admission medications   Medication Sig Start Date End Date Taking? Authorizing Provider  acetaminophen (TYLENOL) 500 MG tablet Take 500 mg by mouth every 6 (six) hours as needed for pain.    Historical Provider, MD  carvedilol (COREG) 12.5 MG tablet Take 12.5 mg by mouth 2 (two) times daily  with a meal.    Historical Provider, MD  furosemide (LASIX) 40 MG tablet Take 40 mg by mouth.    Historical Provider, MD  levothyroxine (SYNTHROID, LEVOTHROID) 75 MCG tablet Take 75 mcg by mouth daily before breakfast.    Historical Provider, MD  losartan (COZAAR) 50 MG tablet Take 50 mg by mouth daily.    Historical Provider, MD  omeprazole (PRILOSEC) 20 MG capsule Take 20 mg by mouth daily.    Historical Provider, MD  Rivaroxaban (XARELTO) 15 MG TABS tablet Take 15 mg by mouth 2 (two) times daily with a meal.    Historical Provider, MD   BP 164/56  Pulse 92  Temp(Src) 98.4 F (36.9 C) (Oral)  Resp 22  Wt 120 lb (54.432 kg)  SpO2 92%  LMP 04/09/2012 Vitals reviewed Physical Exam Physical Examination: General appearance - alert, well appearing, and in no distress Mental status - alert, oriented to person, place, and time Eyes -no conjunctival injection, no scleral icterus Mouth - mucous membranes moist, pharynx normal without lesions Chest - clear to auscultation, no wheezes, rales or rhonchi, symmetric air entry, normal respiratory effort Heart - normal rate, regular rhythm, normal S1, S2, no murmurs, rubs, clicks or gallops Abdomen - soft, nontender, nondistended, no masses or organomegaly Extremities -  peripheral pulses normal, no pedal edema, no clubbing or cyanosis Skin - normal coloration and turgor, no rashes  ED Course  Procedures (including critical care time)  9:43 AM d/w Dr. Macky [facility]  Pt will go to a telemetry bed.   Labs Review Labs Reviewed  CBC - Abnormal; Notable for the following:    RBC 3.80 (*)    All other components within normal limits  BASIC METABOLIC PANEL - Abnormal; Notable for the following:    Calcium 11.2 (*)    GFR calc non Af Amer 46 (*)    GFR calc Af Amer 53 (*)    All other components within normal limits  PRO B NATRIURETIC PEPTIDE - Abnormal; Notable for the following:    Pro B Natriuretic peptide (BNP) 4943.0 (*)     All other components within normal limits  TROPONIN I    Imaging Review Dg Chest 2 View  08/08/2014   CLINICAL DATA:  Productive cough and congestion and cold symptoms; history of atrial fibrillation and hypertension  EXAM: CHEST  2 VIEW  COMPARISON:  PA and lateral chest of June 30, 2014  FINDINGS: The lungs are hyperinflated. There is no focal infiltrate. The cardiac silhouette is enlarged. The central pulmonary vascularity is mildly prominent. There is mild tortuosity of the descending thoracic aorta. There is no significant pleural effusion. There is no pneumothorax. The bony thorax exhibits no acute abnormality.  IMPRESSION: COPD and likely low-grade CHF. There is no alveolar pneumonia. One cannot exclude acute bronchitis in the appropriate clinical setting.   Electronically Signed   By: David  [place]   On: 08/08/2014 07:57     EKG Interpretation   Date/Time:  Monday August 08 2014 07:58:42 EDT Ventricular Rate:  76 PR Interval:    QRS Duration: 108 QT Interval:  394 QTC Calculation: 443 R Axis:   74 Text Interpretation:  Atrial fibrillation with premature ventricular or  aberrantly conducted complexes Septal infarct , age undetermined Abnormal  ECG Since previous tracing PVCs are new Confirmed by [facility]  MD, MARTHA  [phone]) on 08/08/2014 9:04:14 AM      MDM   Final diagnoses:  CHF exacerbation    Pt presenting with c/o productive cough, and leg swelling.  Has completed course of zithromax yesterday without improvement.  CXR shows CHF, BNP 4000, pt given IV lasix in the ED and transferred to [facility] for further treatment.  Pt is agreeable with the plan for admission.      Ethelda Chick, MD 08/08/14 [phone]

## 2015-06-01 DIAGNOSIS — R1084 Generalized abdominal pain: Secondary | ICD-10-CM | POA: Insufficient documentation

## 2015-06-01 DIAGNOSIS — K219 Gastro-esophageal reflux disease without esophagitis: Secondary | ICD-10-CM | POA: Diagnosis not present

## 2015-06-01 DIAGNOSIS — I499 Cardiac arrhythmia, unspecified: Secondary | ICD-10-CM | POA: Diagnosis not present

## 2015-06-01 DIAGNOSIS — E039 Hypothyroidism, unspecified: Secondary | ICD-10-CM | POA: Diagnosis not present

## 2015-06-01 DIAGNOSIS — Z79899 Other long term (current) drug therapy: Secondary | ICD-10-CM | POA: Diagnosis not present

## 2015-06-01 DIAGNOSIS — Z8673 Personal history of transient ischemic attack (TIA), and cerebral infarction without residual deficits: Secondary | ICD-10-CM | POA: Insufficient documentation

## 2015-06-01 DIAGNOSIS — I10 Essential (primary) hypertension: Secondary | ICD-10-CM | POA: Insufficient documentation

## 2015-06-02 ENCOUNTER — Emergency Department (HOSPITAL_BASED_OUTPATIENT_CLINIC_OR_DEPARTMENT_OTHER): Payer: Medicare (Managed Care)

## 2015-06-02 ENCOUNTER — Emergency Department (HOSPITAL_BASED_OUTPATIENT_CLINIC_OR_DEPARTMENT_OTHER)
Admission: EM | Admit: 2015-06-02 | Discharge: 2015-06-02 | Disposition: A | Discharge status: Home / Self Care | Payer: Medicare (Managed Care) | Attending: Emergency Medicine | Admitting: Emergency Medicine

## 2015-06-02 ENCOUNTER — Encounter (HOSPITAL_BASED_OUTPATIENT_CLINIC_OR_DEPARTMENT_OTHER): Payer: Self-pay | Admitting: Emergency Medicine

## 2015-06-02 DIAGNOSIS — R109 Unspecified abdominal pain: Secondary | ICD-10-CM

## 2015-06-02 HISTORY — DX: Cerebral infarction, unspecified: I63.9

## 2015-06-02 LAB — COMPREHENSIVE METABOLIC PANEL
ALT: 17 U/L (ref 14–54)
AST: 27 U/L (ref 15–41)
Albumin: 3.9 g/dL (ref 3.5–5.0)
Alkaline Phosphatase: 83 U/L (ref 38–126)
Anion gap: 5 (ref 5–15)
BUN: 23 mg/dL — AB (ref 6–20)
CALCIUM: 10.5 mg/dL — AB (ref 8.9–10.3)
CO2: 29 mmol/L (ref 22–32)
Chloride: 102 mmol/L (ref 101–111)
Creatinine, Ser: 1.03 mg/dL — ABNORMAL HIGH (ref 0.44–1.00)
GFR, EST AFRICAN AMERICAN: 51 mL/min — AB (ref >60)
GFR, EST NON AFRICAN AMERICAN: 44 mL/min — AB (ref >60)
GLUCOSE: 112 mg/dL — AB (ref 65–99)
POTASSIUM: 4.7 mmol/L (ref 3.5–5.1)
Sodium: 136 mmol/L (ref 135–145)
Total Bilirubin: 1.2 mg/dL (ref 0.3–1.2)
Total Protein: 7 g/dL (ref 6.5–8.1)

## 2015-06-02 LAB — CBC WITH DIFFERENTIAL/PLATELET
BASOS PCT: 0 % (ref 0–1)
Basophils Absolute: 0 10*3/uL (ref 0.0–0.1)
EOS PCT: 1 % (ref 0–5)
Eosinophils Absolute: 0.1 10*3/uL (ref 0.0–0.7)
HCT: 38.2 % (ref 36.0–46.0)
HEMOGLOBIN: 12.3 g/dL (ref 12.0–15.0)
Lymphocytes Relative: 10 % — ABNORMAL LOW (ref 12–46)
Lymphs Abs: 0.9 10*3/uL (ref 0.7–4.0)
MCH: 31.6 pg (ref 26.0–34.0)
MCHC: 32.2 g/dL (ref 30.0–36.0)
MCV: 98.2 fL (ref 78.0–100.0)
Monocytes Absolute: 0.9 10*3/uL (ref 0.1–1.0)
Monocytes Relative: 9 % (ref 3–12)
NEUTROS PCT: 80 % — AB (ref 43–77)
Neutro Abs: 7.8 10*3/uL — ABNORMAL HIGH (ref 1.7–7.7)
Platelets: 159 10*3/uL (ref 150–400)
RBC: 3.89 MIL/uL (ref 3.87–5.11)
RDW: 12.4 % (ref 11.5–15.5)
WBC: 9.6 10*3/uL (ref 4.0–10.5)

## 2015-06-02 LAB — URINALYSIS, ROUTINE W REFLEX MICROSCOPIC
BILIRUBIN URINE: NEGATIVE
Glucose, UA: NEGATIVE mg/dL
Hgb urine dipstick: NEGATIVE
Ketones, ur: NEGATIVE mg/dL
NITRITE: NEGATIVE
Protein, ur: NEGATIVE mg/dL
Specific Gravity, Urine: 1.014 (ref 1.005–1.030)
UROBILINOGEN UA: 1 mg/dL (ref 0.0–1.0)
pH: 7.5 (ref 5.0–8.0)

## 2015-06-02 LAB — URINE MICROSCOPIC-ADD ON

## 2015-06-02 LAB — TROPONIN I: Troponin I: 0.03 ng/mL (ref <0.031)

## 2015-06-02 LAB — BRAIN NATRIURETIC PEPTIDE: B Natriuretic Peptide: 558.7 pg/mL — ABNORMAL HIGH (ref 0.0–100.0)

## 2015-06-02 MED ORDER — GI COCKTAIL ~~LOC~~: 30.0000 mL | Freq: Once | ORAL | Status: DC

## 2015-06-02 NOTE — ED Provider Notes (Signed)
CSN: [id number]     Arrival date & time 06/01/15  2359 History   First MD Initiated Contact with Patient 06/02/15 0014     Chief Complaint  Patient presents with  . Abdominal Pain     (Consider location/radiation/quality/duration/timing/severity/associated sxs/prior Treatment) Patient is a 81 y.o. female presenting with abdominal pain. The history is provided by the patient.  Abdominal Pain Pain location:  Generalized Pain quality: aching   Pain radiates to:  Does not radiate Pain severity:  Mild Onset quality:  Gradual Timing:  Intermittent Progression:  Unchanged Chronicity:  New Context: not alcohol use   Relieved by:  Nothing Worsened by:  Nothing tried Ineffective treatments:  None tried Associated symptoms: no anorexia, no chest pain, no cough and no fever   Risk factors: being elderly   Risk factors: no alcohol abuse     Past Medical History  Diagnosis Date  . Diverticulitis   . Atrial fibrillation, chronic   . Hypertension   . Thyroid disease   . GERD (gastroesophageal reflux disease)   . Hypothyroidism   . Stroke    Past Surgical History  Procedure Laterality Date  . Abdominal hysterectomy    . Tonsillectomy    . Appendectomy    . Joint replacement    . Hernia repair    . Cesarean section    . Hip fracture surgery     History reviewed. No pertinent family history. History  Substance Use Topics  . Smoking status: Never Smoker   . Smokeless tobacco: Not on file  . Alcohol Use: No   OB History    No data available     Review of Systems  Constitutional: Negative for fever.  Respiratory: Negative for cough.   Cardiovascular: Negative for chest pain, palpitations and leg swelling.  Gastrointestinal: Positive for abdominal pain. Negative for anorexia.  All other systems reviewed and are negative.     Allergies  Darvocet and Sulfa antibiotics  Home Medications   Prior to Admission medications   Medication Sig Start Date End Date Taking?  Authorizing Provider  atorvastatin (LIPITOR) 10 MG tablet Take 10 mg by mouth daily.   Yes Historical Provider, MD  acetaminophen (TYLENOL) 500 MG tablet Take 500 mg by mouth every 6 (six) hours as needed for pain.    Historical Provider, MD  carvedilol (COREG) 12.5 MG tablet Take 12.5 mg by mouth 2 (two) times daily with a meal.    Historical Provider, MD  furosemide (LASIX) 40 MG tablet Take 40 mg by mouth.    Historical Provider, MD  levothyroxine (SYNTHROID, LEVOTHROID) 75 MCG tablet Take 75 mcg by mouth daily before breakfast.    Historical Provider, MD  losartan (COZAAR) 50 MG tablet Take 50 mg by mouth daily.    Historical Provider, MD  omeprazole (PRILOSEC) 20 MG capsule Take 20 mg by mouth daily.    Historical Provider, MD  Rivaroxaban (XARELTO) 15 MG TABS tablet Take 15 mg by mouth 2 (two) times daily with a meal.    Historical Provider, MD   BP 151/63 mmHg  Pulse 65  Temp(Src) 98.4 F (36.9 C) (Oral)  Resp 20  Ht 5\' 2"  (1.575 m)  Wt 117 lb (53.071 kg)  BMI 21.39 kg/m2  SpO2 97%  LMP 04/09/2012 Physical Exam  Constitutional: She is oriented to person, place, and time. She appears well-developed and well-nourished. No distress.  HENT:  Head: Normocephalic and atraumatic.  Mouth/Throat: Oropharynx is clear and moist.  Eyes: Conjunctivae are normal.  Pupils are equal, round, and reactive to light.  Neck: Normal range of motion. Neck supple.  Cardiovascular: Normal rate.  An irregularly irregular rhythm present.  Pulmonary/Chest: Effort normal and breath sounds normal. No respiratory distress. She has no wheezes. She has no rales. She exhibits no tenderness.  Abdominal: Soft. She exhibits no distension. Bowel sounds are increased. There is no tenderness. There is no rigidity, no rebound, no guarding, no tenderness at McBurney's point and negative Murphy's sign.  Musculoskeletal: Normal range of motion.  Neurological: She is alert and oriented to person, place, and time.  Skin:  Skin is warm and dry.  Psychiatric: She has a normal mood and affect.    ED Course  Procedures (including critical care time) Labs Review Labs Reviewed  CBC WITH DIFFERENTIAL/PLATELET - Abnormal; Notable for the following:    Neutrophils Relative % 80 (*)    Neutro Abs 7.8 (*)    Lymphocytes Relative 10 (*)    All other components within normal limits  COMPREHENSIVE METABOLIC PANEL - Abnormal; Notable for the following:    Glucose, Bld 112 (*)    BUN 23 (*)    Creatinine, Ser 1.03 (*)    Calcium 10.5 (*)    GFR calc non Af Amer 44 (*)    GFR calc Af Amer 51 (*)    All other components within normal limits  URINALYSIS, ROUTINE W REFLEX MICROSCOPIC (NOT AT [facility]) - Abnormal; Notable for the following:    Leukocytes, UA TRACE (*)    All other components within normal limits  BRAIN NATRIURETIC PEPTIDE - Abnormal; Notable for the following:    B Natriuretic Peptide 558.7 (*)    All other components within normal limits  URINE MICROSCOPIC-ADD ON - Abnormal; Notable for the following:    Squamous Epithelial / LPF FEW (*)    Bacteria, UA FEW (*)    All other components within normal limits  URINE CULTURE  TROPONIN I    Imaging Review Dg Abd Acute W/chest  06/02/2015   CLINICAL DATA:  Acute onset abdominal pain, slight nausea, shortness of breath and increased urinary frequency. Initial encounter.  EXAM: DG ABDOMEN ACUTE W/ 1V CHEST  COMPARISON:  Chest radiograph from 12/16/2014, and abdominal radiograph performed 10/04/2014  FINDINGS: The lungs are well-aerated. Minimal bibasilar atelectasis is noted. There is no evidence of focal opacification, pleural effusion or pneumothorax. The cardiomediastinal silhouette is mildly enlarged.  The visualized bowel gas pattern is unremarkable. Scattered stool and air are seen within the colon; there is no evidence of small bowel dilatation to suggest obstruction. No free intra-abdominal air is identified on the provided upright view. Postoperative  change is noted at the right upper quadrant.  No acute osseous abnormalities are seen; the sacroiliac joints are unremarkable in appearance. Mild degenerative change is noted along the lumbar spine, and at the sacroiliac joints. A left femoral intramedullary rod and screw are only partially imaged, but appear grossly unremarkable. Degenerative change is noted at the pubic symphysis.  IMPRESSION: 1. Unremarkable bowel gas pattern; no free intra-abdominal air seen. Moderate amount of stool noted in the colon. 2. Minimal bibasilar atelectasis noted; lungs otherwise clear. Mild cardiomegaly.   Electronically Signed   By: Roanna Raider M.D.   On: 06/02/2015 03:02     EKG Interpretation   Date/Time:  Friday June 02 2015 00:39:54 EDT Ventricular Rate:  80 PR Interval:    QRS Duration: 106 QT Interval:  388 QTC Calculation: 447 R Axis:   29  Text Interpretation:  Atrial fibrillation with premature ventricular or  aberrantly conducted complexes Confirmed by [facility]  MD, Maleaha Hughett  ([id number]) on 06/02/2015 2:00:33 AM      MDM   Final diagnoses:  None    Well appearing.  Patient later admitted she felt fine, she just wanted a check up to make sure she was fine because she gets anxious at the age of 24.  All imaging and labs are normal.  Patient is well appearing and safe for discharge at this time    Kennieth Plotts, MD 06/02/15 6045

## 2015-06-02 NOTE — ED Notes (Signed)
Pt having mid abd pain, slight nausea, SOB, and urinary frequency since yesterday. Pt reports sx as mild.

## 2015-07-03 ENCOUNTER — Emergency Department (HOSPITAL_BASED_OUTPATIENT_CLINIC_OR_DEPARTMENT_OTHER): Payer: Medicare Other

## 2015-07-03 ENCOUNTER — Encounter (HOSPITAL_BASED_OUTPATIENT_CLINIC_OR_DEPARTMENT_OTHER): Payer: Self-pay | Admitting: Emergency Medicine

## 2015-07-03 ENCOUNTER — Emergency Department (HOSPITAL_BASED_OUTPATIENT_CLINIC_OR_DEPARTMENT_OTHER)
Admission: EM | Admit: 2015-07-03 | Discharge: 2015-07-03 | Disposition: A | Discharge status: Home / Self Care | Payer: Medicare Other | Attending: Emergency Medicine | Admitting: Emergency Medicine

## 2015-07-03 DIAGNOSIS — Z8673 Personal history of transient ischemic attack (TIA), and cerebral infarction without residual deficits: Secondary | ICD-10-CM | POA: Diagnosis not present

## 2015-07-03 DIAGNOSIS — K219 Gastro-esophageal reflux disease without esophagitis: Secondary | ICD-10-CM | POA: Insufficient documentation

## 2015-07-03 DIAGNOSIS — E039 Hypothyroidism, unspecified: Secondary | ICD-10-CM | POA: Diagnosis not present

## 2015-07-03 DIAGNOSIS — Z79899 Other long term (current) drug therapy: Secondary | ICD-10-CM | POA: Insufficient documentation

## 2015-07-03 DIAGNOSIS — R531 Weakness: Secondary | ICD-10-CM | POA: Diagnosis not present

## 2015-07-03 DIAGNOSIS — R059 Cough, unspecified: Secondary | ICD-10-CM

## 2015-07-03 DIAGNOSIS — I10 Essential (primary) hypertension: Secondary | ICD-10-CM | POA: Insufficient documentation

## 2015-07-03 DIAGNOSIS — I509 Heart failure, unspecified: Secondary | ICD-10-CM | POA: Insufficient documentation

## 2015-07-03 DIAGNOSIS — R05 Cough: Secondary | ICD-10-CM | POA: Diagnosis not present

## 2015-07-03 HISTORY — DX: Heart failure, unspecified: I50.9

## 2015-07-03 LAB — BASIC METABOLIC PANEL
Anion gap: 7 (ref 5–15)
BUN: 23 mg/dL — ABNORMAL HIGH (ref 6–20)
CALCIUM: 10.8 mg/dL — AB (ref 8.9–10.3)
CO2: 32 mmol/L (ref 22–32)
CREATININE: 1.08 mg/dL — AB (ref 0.44–1.00)
Chloride: 101 mmol/L (ref 101–111)
GFR calc non Af Amer: 42 mL/min — ABNORMAL LOW (ref >60)
GFR, EST AFRICAN AMERICAN: 48 mL/min — AB (ref >60)
Glucose, Bld: 102 mg/dL — ABNORMAL HIGH (ref 65–99)
Potassium: 4.4 mmol/L (ref 3.5–5.1)
Sodium: 140 mmol/L (ref 135–145)

## 2015-07-03 LAB — URINALYSIS, ROUTINE W REFLEX MICROSCOPIC
BILIRUBIN URINE: NEGATIVE
Glucose, UA: NEGATIVE mg/dL
Hgb urine dipstick: NEGATIVE
Ketones, ur: NEGATIVE mg/dL
NITRITE: NEGATIVE
PH: 7.5 (ref 5.0–8.0)
Protein, ur: NEGATIVE mg/dL
Specific Gravity, Urine: 1.011 (ref 1.005–1.030)
UROBILINOGEN UA: 1 mg/dL (ref 0.0–1.0)

## 2015-07-03 LAB — TROPONIN I: Troponin I: 0.03 ng/mL (ref <0.031)

## 2015-07-03 LAB — URINE MICROSCOPIC-ADD ON

## 2015-07-03 LAB — CBC WITH DIFFERENTIAL/PLATELET
BASOS ABS: 0 10*3/uL (ref 0.0–0.1)
BASOS PCT: 0 % (ref 0–1)
EOS PCT: 1 % (ref 0–5)
Eosinophils Absolute: 0.1 10*3/uL (ref 0.0–0.7)
HCT: 38.3 % (ref 36.0–46.0)
Hemoglobin: 12.2 g/dL (ref 12.0–15.0)
Lymphocytes Relative: 17 % (ref 12–46)
Lymphs Abs: 1.3 10*3/uL (ref 0.7–4.0)
MCH: 31.9 pg (ref 26.0–34.0)
MCHC: 31.9 g/dL (ref 30.0–36.0)
MCV: 100 fL (ref 78.0–100.0)
MONO ABS: 0.9 10*3/uL (ref 0.1–1.0)
Monocytes Relative: 12 % (ref 3–12)
Neutro Abs: 5.3 10*3/uL (ref 1.7–7.7)
Neutrophils Relative %: 70 % (ref 43–77)
Platelets: 160 10*3/uL (ref 150–400)
RBC: 3.83 MIL/uL — ABNORMAL LOW (ref 3.87–5.11)
RDW: 12.5 % (ref 11.5–15.5)
WBC: 7.6 10*3/uL (ref 4.0–10.5)

## 2015-07-03 NOTE — ED Notes (Signed)
Pt sent by [facility] for an abnormal xray.  She was originally seen for feeling like something was stuck in her throat and they did an xray that showed an abnormality on the left side.  Caregiver states that pt had a cough and SOB last week, was put on unknown new medication and improved since then.  Pt denies any pain, no sore throat, no cough, no fevers.

## 2015-07-03 NOTE — ED Provider Notes (Signed)
CSN: [id number]     Arrival date & time 07/03/15  2003 History  This chart was scribed for Benjiman Core, MD by Budd Palmer, ED Scribe. This patient was seen in room MH08/MH08 and the patient's care was started at 8:21 PM.      Chief Complaint  Patient presents with  . Cough   The history is provided by the patient and a caregiver. No language interpreter was used.   HPI Comments: Level 5 Caveat Kellie Garcia is a 81 y.o. female with a PMHx of HTN, A-fib, CHF and stroke, who presents to the Emergency Department complaining of worsening, intermittent cough onset 1.5 weeks ago. She reports associated weakness. Per caregiver, pt had complained of something caught in her throat, and was coughing a lot more since this morning. She was given Mucinex with no relief. Pt has been to urgent care, where she got an XR, which came back abnormal. She has been seen by a physician who states there were abnormal sounds in the left lung, and was told it could be fluid build-up or PNA. Pt denies n/v/d, loss of appetite, and urinary symptoms.  Past Medical History  Diagnosis Date  . Diverticulitis   . Atrial fibrillation, chronic   . Hypertension   . Thyroid disease   . GERD (gastroesophageal reflux disease)   . Hypothyroidism   . Stroke   . CHF (congestive heart failure)    Past Surgical History  Procedure Laterality Date  . Abdominal hysterectomy    . Tonsillectomy    . Appendectomy    . Joint replacement    . Hernia repair    . Cesarean section    . Hip fracture surgery     No family history on file. Social History  Substance Use Topics  . Smoking status: Never Smoker   . Smokeless tobacco: Not on file  . Alcohol Use: No   OB History    No data available     Review of Systems  Unable to perform ROS: Other  Constitutional: Negative for appetite change.  Respiratory: Positive for cough.   Gastrointestinal: Negative for nausea, vomiting and diarrhea.  Genitourinary: Negative for  dysuria, frequency and difficulty urinating.  Neurological: Positive for weakness.  All other systems reviewed and are negative.   Allergies  Darvocet and Sulfa antibiotics  Home Medications   Prior to Admission medications   Medication Sig Start Date End Date Taking? Authorizing Provider  acetaminophen (TYLENOL) 500 MG tablet Take 500 mg by mouth every 6 (six) hours as needed for pain.    Historical Provider, MD  atorvastatin (LIPITOR) 10 MG tablet Take 10 mg by mouth daily.    Historical Provider, MD  carvedilol (COREG) 12.5 MG tablet Take 12.5 mg by mouth 2 (two) times daily with a meal.    Historical Provider, MD  furosemide (LASIX) 40 MG tablet Take 40 mg by mouth.    Historical Provider, MD  levothyroxine (SYNTHROID, LEVOTHROID) 75 MCG tablet Take 75 mcg by mouth daily before breakfast.    Historical Provider, MD  losartan (COZAAR) 50 MG tablet Take 50 mg by mouth daily.    Historical Provider, MD  omeprazole (PRILOSEC) 20 MG capsule Take 20 mg by mouth daily.    Historical Provider, MD  Rivaroxaban (XARELTO) 15 MG TABS tablet Take 15 mg by mouth 2 (two) times daily with a meal.    Historical Provider, MD   BP 188/97 mmHg  Pulse 75  Temp(Src) 98 F (36.7 C) (  Oral)  Resp 16  Wt 120 lb (54.432 kg)  SpO2 96%  LMP 04/09/2012 Physical Exam  Constitutional: She appears well-developed and well-nourished.  Awake and pleasant  HENT:  Head: Normocephalic and atraumatic.  Mouth/Throat: Oropharynx is clear and moist.  Posterior pharynx is normal  Eyes: Conjunctivae are normal. Right eye exhibits no discharge. Left eye exhibits no discharge.  Neck:  No midline neck tenderness  Pulmonary/Chest: Effort normal. No respiratory distress. She has rales.  Rare, scattered Rales in the upper left lung. No respiratory distress.  Abdominal: There is no tenderness.  No abdominal ttp  Musculoskeletal: She exhibits no edema.  No pitting edema to the lower extremities  Neurological: She is  alert. Coordination normal.  Skin: Skin is warm and dry. No rash noted. She is not diaphoretic. No erythema.  Psychiatric: She has a normal mood and affect.  Nursing note and vitals reviewed.   ED Course  Procedures  DIAGNOSTIC STUDIES: Oxygen Saturation is 97% on RA, adequate by my interpretation.    COORDINATION OF CARE: 8:25 PM - Discussed plans to order diagnostic studies and imaging. Pt advised of plan for treatment and pt agrees.  Labs Review Labs Reviewed  CBC WITH DIFFERENTIAL/PLATELET - Abnormal; Notable for the following:    RBC 3.83 (*)    All other components within normal limits  URINALYSIS, ROUTINE W REFLEX MICROSCOPIC (NOT AT [facility]) - Abnormal; Notable for the following:    Leukocytes, UA SMALL (*)    All other components within normal limits  BASIC METABOLIC PANEL - Abnormal; Notable for the following:    Glucose, Bld 102 (*)    BUN 23 (*)    Creatinine, Ser 1.08 (*)    Calcium 10.8 (*)    GFR calc non Af Amer 42 (*)    GFR calc Af Amer 48 (*)    All other components within normal limits  TROPONIN I  URINE MICROSCOPIC-ADD ON    Imaging Review Dg Chest 2 View  07/03/2015   CLINICAL DATA:  Cough, weakness, and shortness of breath for 1 week.  EXAM: CHEST  2 VIEW  COMPARISON:  06/02/2015  FINDINGS: Mild cardiac enlargement. No pulmonary vascular congestion. No focal airspace disease or consolidation in the lungs. Probable emphysematous changes and scattered fibrosis in the lungs. Peribronchial thickening suggesting chronic bronchitis. No blunting of costophrenic angles. No pneumothorax. Calcified and tortuous aorta. Degenerative changes throughout the thoracic spine with prominent degenerative change in the right shoulder. Old resection or resorption of the distal right clavicle.  IMPRESSION: Cardiac enlargement. Emphysematous changes and chronic bronchitic changes in the lungs. No evidence of active pulmonary disease.   Electronically Signed   By: Burman Nieves  M.D.   On: 07/03/2015 21:08   I have personally reviewed and evaluated these images and lab results as part of my medical decision-making.   EKG Interpretation None      MDM   Final diagnoses:  Cough    Patient was sent in.  Has been feeling a little weak and had a cough. Reportedly had abnormality on x-ray and abnormal lung findings. Overall lung exam reassuring. Normal posterior pharynx examination. Lab work overall reassuring. Creatinine is minimally elevated but has been at this level before. X-ray does not show abnormality. Will discharge home to follow-up as needed.    I personally performed the services described in this documentation, which was scribed in my presence. The recorded information has been reviewed and is accurate.    Benjiman Core, MD  07/03/15 2252 

## 2015-07-03 NOTE — Discharge Instructions (Signed)
Cough, Adult  A cough is a reflex that helps clear your throat and airways. It can help heal the body or may be a reaction to an irritated airway. A cough may only last 2 or 3 weeks (acute) or may last more than 8 weeks (chronic).  CAUSES Acute cough:  Viral or bacterial infections. Chronic cough:  Infections.  Allergies.  Asthma.  Post-nasal drip.  Smoking.  Heartburn or acid reflux.  Some medicines.  Chronic lung problems (COPD).  Cancer. SYMPTOMS   Cough.  Fever.  Chest pain.  Increased breathing rate.  High-pitched whistling sound when breathing (wheezing).  Colored mucus that you cough up (sputum). TREATMENT   A bacterial cough may be treated with antibiotic medicine.  A viral cough must run its course and will not respond to antibiotics.  Your caregiver may recommend other treatments if you have a chronic cough. HOME CARE INSTRUCTIONS   Only take over-the-counter or prescription medicines for pain, discomfort, or fever as directed by your caregiver. Use cough suppressants only as directed by your caregiver.  Use a cold steam vaporizer or humidifier in your bedroom or home to help loosen secretions.  Sleep in a semi-upright position if your cough is worse at night.  Rest as needed.  Stop smoking if you smoke. SEEK IMMEDIATE MEDICAL CARE IF:   You have pus in your sputum.  Your cough starts to worsen.  You cannot control your cough with suppressants and are losing sleep.  You begin coughing up blood.  You have difficulty breathing.  You develop pain which is getting worse or is uncontrolled with medicine.  You have a fever. MAKE SURE YOU:   Understand these instructions.  Will watch your condition.  Will get help right away if you are not doing well or get worse. Document Released: 04/26/2011 Document Revised: 01/20/2012 Document Reviewed: 04/26/2011 Exit[employer] Patient Information 2015 [employer]. This information is not intended  to replace advice given to you by your health care provider. Make sure you discuss any questions you have with your health care provider.  

## 2015-07-03 NOTE — ED Notes (Signed)
C/o cough and sob x 1 week  States feels like had something in throat

## 2015-07-24 ENCOUNTER — Emergency Department (HOSPITAL_BASED_OUTPATIENT_CLINIC_OR_DEPARTMENT_OTHER): Payer: Medicare Other

## 2015-07-24 ENCOUNTER — Encounter (HOSPITAL_BASED_OUTPATIENT_CLINIC_OR_DEPARTMENT_OTHER): Payer: Self-pay | Admitting: *Deleted

## 2015-07-24 ENCOUNTER — Emergency Department (HOSPITAL_BASED_OUTPATIENT_CLINIC_OR_DEPARTMENT_OTHER)
Admission: EM | Admit: 2015-07-24 | Discharge: 2015-07-24 | Disposition: A | Discharge status: Home / Self Care | Payer: Medicare Other | Attending: Emergency Medicine | Admitting: Emergency Medicine

## 2015-07-24 DIAGNOSIS — K219 Gastro-esophageal reflux disease without esophagitis: Secondary | ICD-10-CM | POA: Diagnosis not present

## 2015-07-24 DIAGNOSIS — I10 Essential (primary) hypertension: Secondary | ICD-10-CM | POA: Diagnosis not present

## 2015-07-24 DIAGNOSIS — I482 Chronic atrial fibrillation: Secondary | ICD-10-CM | POA: Diagnosis not present

## 2015-07-24 DIAGNOSIS — E039 Hypothyroidism, unspecified: Secondary | ICD-10-CM | POA: Diagnosis not present

## 2015-07-24 DIAGNOSIS — Z8673 Personal history of transient ischemic attack (TIA), and cerebral infarction without residual deficits: Secondary | ICD-10-CM | POA: Insufficient documentation

## 2015-07-24 DIAGNOSIS — M7989 Other specified soft tissue disorders: Secondary | ICD-10-CM | POA: Diagnosis present

## 2015-07-24 DIAGNOSIS — Z79899 Other long term (current) drug therapy: Secondary | ICD-10-CM | POA: Insufficient documentation

## 2015-07-24 DIAGNOSIS — M7981 Nontraumatic hematoma of soft tissue: Secondary | ICD-10-CM | POA: Insufficient documentation

## 2015-07-24 DIAGNOSIS — T148XXA Other injury of unspecified body region, initial encounter: Secondary | ICD-10-CM

## 2015-07-24 DIAGNOSIS — I509 Heart failure, unspecified: Secondary | ICD-10-CM | POA: Insufficient documentation

## 2015-07-24 NOTE — Discharge Instructions (Signed)

## 2015-07-24 NOTE — ED Provider Notes (Signed)
CSN: [id number]     Arrival date & time 07/24/15  1522 History  This chart was scribed for Gwyneth Sprout, MD by Ronney Lion, ED Scribe. This patient was seen in room MH07/MH07 and the patient's care was started at 3:48 PM.    Chief Complaint  Patient presents with  . Leg Swelling    The history is provided by the patient and a caregiver. No language interpreter was used.    HPI Comments: Kellie Garcia is a 81 y.o. female currently anticoagulated on Xarelto qhs, with a history of CVA, CHF, A-fib, and hypertension, who presents to the Emergency Department complaining of a bruise and hard spot on her right lower leg that her caregiver first noticed 3 days ago, and became increasingly hard and started to protrude over the past 3 days. Caregiver initially pointed this apparent bruise out to patient, but patient did not recall ever bumping her leg. Her caregiver had taken her to her PCP today to have her normal B12 injection and was sent here out of suspicion that the area was a blood clot. Patient had a CVA 6 months ago, despite being on Xarelto at that time. Caregiver denies any lower extremity blood clots at that time. Patient states moving her right leg does not affect the pain. Patient's caregiver states she patient has not been taking Xarelto with food, as she has never been instructed to do so.   Past Medical History  Diagnosis Date  . Diverticulitis   . Atrial fibrillation, chronic   . Hypertension   . Thyroid disease   . GERD (gastroesophageal reflux disease)   . Hypothyroidism   . Stroke   . CHF (congestive heart failure)    Past Surgical History  Procedure Laterality Date  . Abdominal hysterectomy    . Tonsillectomy    . Appendectomy    . Joint replacement    . Hernia repair    . Cesarean section    . Hip fracture surgery     No family history on file. Social History  Substance Use Topics  . Smoking status: Never Smoker   . Smokeless tobacco: None  . Alcohol Use: No    OB History    No data available     Review of Systems  Skin: Positive for color change (bruising discoloration).  All other systems reviewed and are negative.   Allergies  Darvocet and Sulfa antibiotics  Home Medications   Prior to Admission medications   Medication Sig Start Date End Date Taking? Authorizing Provider  acetaminophen (TYLENOL) 500 MG tablet Take 500 mg by mouth every 6 (six) hours as needed for pain.    Historical Provider, MD  atorvastatin (LIPITOR) 10 MG tablet Take 10 mg by mouth daily.    Historical Provider, MD  carvedilol (COREG) 12.5 MG tablet Take 12.5 mg by mouth 2 (two) times daily with a meal.    Historical Provider, MD  furosemide (LASIX) 40 MG tablet Take 40 mg by mouth.    Historical Provider, MD  levothyroxine (SYNTHROID, LEVOTHROID) 75 MCG tablet Take 75 mcg by mouth daily before breakfast.    Historical Provider, MD  losartan (COZAAR) 50 MG tablet Take 50 mg by mouth daily.    Historical Provider, MD  omeprazole (PRILOSEC) 20 MG capsule Take 20 mg by mouth daily.    Historical Provider, MD  Rivaroxaban (XARELTO) 15 MG TABS tablet Take 15 mg by mouth 2 (two) times daily with a meal.    Historical Provider,  MD   BP 165/66 mmHg  Pulse 56  Temp(Src) 98.1 F (36.7 C) (Oral)  Resp 18  Ht 5\' 4"  (1.626 m)  Wt 121 lb 8 oz (55.112 kg)  BMI 20.85 kg/m2  SpO2 95%  LMP 04/09/2012 Physical Exam  Constitutional: She is oriented to person, place, and time. She appears well-developed and well-nourished. No distress.  HENT:  Head: Normocephalic and atraumatic.  Eyes: Conjunctivae and EOM are normal.  Neck: Neck supple. No tracheal deviation present.  Cardiovascular:  Irregularly irregular.   Pulmonary/Chest: Effort normal. No respiratory distress.  Musculoskeletal: Normal range of motion.  Firm, dime-sized hematoma in medial mid lower right leg. Mildly tender to touch, with surrounding ecchymosis. Normal movement and sensation of the right knee and  ankle, without pain. Dopplerable. PT pulses in both feet.  Neurological: She is alert and oriented to person, place, and time.  Skin: Skin is warm and dry.  Psychiatric: She has a normal mood and affect. Her behavior is normal.  Nursing note and vitals reviewed.   ED Course  Procedures (including critical care time)  DIAGNOSTIC STUDIES: Oxygen Saturation is 95% on RA, normal by my interpretation.    COORDINATION OF CARE: 3:56 PM - Discussed with caregiver that she should taken Xarelto with food. Discussed treatment plan with pt's caregiver at bedside, which includes U/S of RLE. Pt's caregiver verbalized understanding and agreed to plan.   Imaging Review [place] Extrem Low Right Comp  07/24/2015   CLINICAL DATA:  Bruised knot on the medial distal right lower leg. Patient is on Xarelto.  EXAM: ULTRASOUND RIGHT LOWER EXTREMITY COMPLETE  TECHNIQUE: Ultrasound examination was performed including evaluation of the muscles, tendons, joint, and adjacent soft tissues.  COMPARISON:  None.  FINDINGS: Targeted ultrasound is performed in the area of patient's concern. In the area concern there is a small hypoechoic collection which measures 1.2 x 0.7 x 1.0 cm. The surrounding tissues are hyperechoic. The abnormality is in the superficial soft tissues of the medial aspect of the distal right leg. No internal blood flow was identified. Findings likely represent a hematoma and surrounding edema.  IMPRESSION: Superficial 1.2 cm collection consistent with hematoma in the area concern. Followup physical exam is recommended. If there is persistent mass, followup ultrasound is recommended.   Electronically Signed   By: Norva Pavlov M.D.   On: 07/24/2015 17:05   I have personally reviewed and evaluated these images and lab results as part of my medical decision-making.   EKG Interpretation   Date/Time:  Monday July 24 2015 15:49:48 EDT Ventricular Rate:  62 PR Interval:    QRS Duration: 106 QT Interval:   418 QTC Calculation: 424 R Axis:   52 Text Interpretation:  Atrial fibrillation Cannot rule out Anterior infarct  , age undetermined No significant change since last tracing Confirmed by  [facility]  MD, Alphonzo Lemmings ([id number]) on 07/24/2015 4:02:45 PM      MDM   Final diagnoses:  Hematoma   Patient presenting today with a small lesion to the medial right lower extremity with surrounding ecchymosis. Patient does not recall any injury however she has severe dementia and cannot remember most things. Caregiver does not recall an injury. Patient does take Xarelto has not been taking it appropriately. She has not been taking it with food. Caregiver denies any prior history of DVT and she takes xarelto for stroke and A. Fib.  Area on the leg appears to be a hematoma. Ultrasound to confirm. No evidence of deep clot  at this time with no calf tenderness, swelling and normal mobility of knee and the ankle. Neurovascularly intact. Patient today denies any chest pain or shortness of breath.  5:10 PM Ultrasound confirmed hematoma without other concerning factors. Patient discharged home. I personally performed the services described in this documentation, which was scribed in my presence.  The recorded information has been reviewed and considered.     Gwyneth Sprout, MD 07/24/15 1711

## 2015-07-24 NOTE — ED Notes (Signed)
Legs are swollen. She has a bruise and hard spot on her right lower leg. No pain.

## 2017-01-30 ENCOUNTER — Encounter (HOSPITAL_BASED_OUTPATIENT_CLINIC_OR_DEPARTMENT_OTHER): Payer: Self-pay | Admitting: *Deleted

## 2017-01-30 ENCOUNTER — Emergency Department (HOSPITAL_BASED_OUTPATIENT_CLINIC_OR_DEPARTMENT_OTHER)
Admission: EM | Admit: 2017-01-30 | Discharge: 2017-01-30 | Disposition: A | Discharge status: Other Acute Inpatient Hospital | Payer: Medicare Other | Attending: Emergency Medicine | Admitting: Emergency Medicine

## 2017-01-30 DIAGNOSIS — E039 Hypothyroidism, unspecified: Secondary | ICD-10-CM | POA: Insufficient documentation

## 2017-01-30 DIAGNOSIS — Z79899 Other long term (current) drug therapy: Secondary | ICD-10-CM | POA: Diagnosis not present

## 2017-01-30 DIAGNOSIS — K922 Gastrointestinal hemorrhage, unspecified: Secondary | ICD-10-CM | POA: Diagnosis not present

## 2017-01-30 DIAGNOSIS — K625 Hemorrhage of anus and rectum: Secondary | ICD-10-CM | POA: Diagnosis present

## 2017-01-30 DIAGNOSIS — I509 Heart failure, unspecified: Secondary | ICD-10-CM | POA: Insufficient documentation

## 2017-01-30 DIAGNOSIS — I110 Hypertensive heart disease with heart failure: Secondary | ICD-10-CM | POA: Insufficient documentation

## 2017-01-30 LAB — COMPREHENSIVE METABOLIC PANEL
ALT: 10 U/L — AB (ref 14–54)
AST: 20 U/L (ref 15–41)
Albumin: 4 g/dL (ref 3.5–5.0)
Alkaline Phosphatase: 93 U/L (ref 38–126)
Anion gap: 6 (ref 5–15)
BUN: 30 mg/dL — ABNORMAL HIGH (ref 6–20)
CHLORIDE: 101 mmol/L (ref 101–111)
CO2: 33 mmol/L — AB (ref 22–32)
CREATININE: 1.21 mg/dL — AB (ref 0.44–1.00)
Calcium: 11.1 mg/dL — ABNORMAL HIGH (ref 8.9–10.3)
GFR, EST AFRICAN AMERICAN: 41 mL/min — AB (ref >60)
GFR, EST NON AFRICAN AMERICAN: 36 mL/min — AB (ref >60)
Glucose, Bld: 96 mg/dL (ref 65–99)
POTASSIUM: 4.6 mmol/L (ref 3.5–5.1)
Sodium: 140 mmol/L (ref 135–145)
Total Bilirubin: 0.9 mg/dL (ref 0.3–1.2)
Total Protein: 7.5 g/dL (ref 6.5–8.1)

## 2017-01-30 LAB — CBC
HEMATOCRIT: 39 % (ref 36.0–46.0)
HEMOGLOBIN: 12.6 g/dL (ref 12.0–15.0)
MCH: 32.1 pg (ref 26.0–34.0)
MCHC: 32.3 g/dL (ref 30.0–36.0)
MCV: 99.2 fL (ref 78.0–100.0)
Platelets: 154 10*3/uL (ref 150–400)
RBC: 3.93 MIL/uL (ref 3.87–5.11)
RDW: 12.6 % (ref 11.5–15.5)
WBC: 7.3 10*3/uL (ref 4.0–10.5)

## 2017-01-30 LAB — PROTIME-INR
INR: 1.45
Prothrombin Time: 17.8 seconds — ABNORMAL HIGH (ref 11.4–15.2)

## 2017-01-30 LAB — OCCULT BLOOD X 1 CARD TO LAB, STOOL: FECAL OCCULT BLD: POSITIVE — AB

## 2017-01-30 MED ORDER — PANTOPRAZOLE SODIUM 40 MG IV SOLR
40.0000 mg | Freq: Once | INTRAVENOUS | Status: AC
  Administered 2017-01-30: 40 mg via INTRAVENOUS
  Filled 2017-01-30: qty 40

## 2017-01-30 NOTE — ED Notes (Signed)
Pt's caregiver reports that the patient had a bowel movement and there was dark stools.

## 2017-01-30 NOTE — ED Notes (Signed)
Paged hospitalist @ [facility] at patient's request

## 2017-01-30 NOTE — ED Triage Notes (Signed)
Rectal bleeding. Care giver states last night she had a very large BM with no blood noted. Today she has a BM with large clots and large amount of bright red blood from her rectum.

## 2017-02-08 NOTE — ED Provider Notes (Signed)
[facility]-EMERGENCY DEPT Provider Note   CSN: [id number] Arrival date & time: 01/30/17  1505     History   Chief Complaint Chief Complaint  Patient presents with  . Rectal Bleeding    HPI Kellie Garcia is a 83 y.o. female.  HPI Patient is anticoagulated on Xarelto for chronic atrial fibrillation. Her caregiver reports that this morning she had a large bowel movement that contained both blood clots and bright red blood. Patient had not had any complaints of pain or other problems leading up to this. He has been at baseline in terms of function and level of alertness. Patient's caregiver does report that the evening before her presentation she did have a dark stool that was the largest she had ever seen her have. At that time, and they did not recognize that is being blood. Patient has no acute complaints. She does not recognize that there is anything wrong with her at this time. She states she feels fine. Past Medical History:  Diagnosis Date  . Atrial fibrillation, chronic (HCC)   . CHF (congestive heart failure) (HCC)   . Diverticulitis   . GERD (gastroesophageal reflux disease)   . Hypertension   . Hypothyroidism   . Stroke (HCC)   . Thyroid disease     There are no active problems to display for this patient.   Past Surgical History:  Procedure Laterality Date  . ABDOMINAL HYSTERECTOMY    . APPENDECTOMY    . CESAREAN SECTION    . HERNIA REPAIR    . HIP FRACTURE SURGERY    . JOINT REPLACEMENT    . TONSILLECTOMY      OB History    No data available       Home Medications    Prior to Admission medications   Medication Sig Start Date End Date Taking? Authorizing Provider  acetaminophen (TYLENOL) 500 MG tablet Take 500 mg by mouth every 6 (six) hours as needed for pain.   Yes Historical Provider, MD  atorvastatin (LIPITOR) 10 MG tablet Take 10 mg by mouth daily.   Yes Historical Provider, MD  furosemide (LASIX) 40 MG tablet Take 40 mg by mouth.    Yes Historical Provider, MD  levothyroxine (SYNTHROID, LEVOTHROID) 75 MCG tablet Take 75 mcg by mouth daily before breakfast.   Yes Historical Provider, MD  losartan (COZAAR) 50 MG tablet Take 50 mg by mouth daily.   Yes Historical Provider, MD  omeprazole (PRILOSEC) 20 MG capsule Take 20 mg by mouth daily.   Yes Historical Provider, MD  Rivaroxaban (XARELTO) 15 MG TABS tablet Take 15 mg by mouth 2 (two) times daily with a meal.   Yes Historical Provider, MD  carvedilol (COREG) 12.5 MG tablet Take 12.5 mg by mouth 2 (two) times daily with a meal.    Historical Provider, MD    Family History No family history on file.  Social History Social History  Substance Use Topics  . Smoking status: Never Smoker  . Smokeless tobacco: Never Used  . Alcohol use No     Allergies   Darvocet [propoxyphene n-acetaminophen] and Sulfa antibiotics   Review of Systems Review of Systems 10 Systems reviewed and are negative for acute change except as noted in the HPI.  10 Systems reviewed and are negative for acute change except as noted in the HPI. Physical Exam Updated Vital Signs BP (!) 163/75   Pulse 76   Temp 97.6 F (36.4 C)   Resp 20  Ht  (1.549 m)   Wt 122 lb (55.3 kg)   LMP 04/09/2012   SpO2 95%   BMI 23.05 kg/m   Physical Exam  Constitutional: She appears well-developed and well-nourished. No distress.  Patient is very well appearing for age. She is alert and nontoxic. Interactive with no respiratory distress.  HENT:  Head: Normocephalic and atraumatic.  Mouth/Throat: Oropharynx is clear and moist.  Eyes: Conjunctivae and EOM are normal.  Neck: Neck supple.  Cardiovascular: Normal rate and intact distal pulses.   No murmur heard. Irregularly irregular heart rate. No tachycardia.  Pulmonary/Chest: Effort normal and breath sounds normal. No respiratory distress.  Abdominal: Soft. She exhibits no distension. There is no tenderness. There is no guarding.  Genitourinary:    Genitourinary Comments: Rectal examination positive for melanotic stool.  Musculoskeletal: She exhibits no edema or tenderness.  Neurological: She is alert. No cranial nerve deficit. She exhibits normal muscle tone. Coordination normal.  Patient shows some signs of dementia in terms of repetitive questioning about historical events. She however is quite alert and interactive.  Skin: Skin is warm and dry.  Psychiatric: She has a normal mood and affect.  Nursing note and vitals reviewed.    ED Treatments / Results  Labs (all labs ordered are listed, but only abnormal results are displayed) Labs Reviewed  COMPREHENSIVE METABOLIC PANEL - Abnormal; Notable for the following:       Result Value   CO2 33 (*)    BUN 30 (*)    Creatinine, Ser 1.21 (*)    Calcium 11.1 (*)    ALT 10 (*)    GFR calc non Af Amer 36 (*)    GFR calc Af Amer 41 (*)    All other components within normal limits  PROTIME-INR - Abnormal; Notable for the following:    Prothrombin Time 17.8 (*)    All other components within normal limits  OCCULT BLOOD X 1 CARD TO LAB, STOOL - Abnormal; Notable for the following:    Fecal Occult Bld POSITIVE (*)    All other components within normal limits  CBC    EKG  EKG Interpretation None       Radiology No results found.  Procedures Procedures (including critical care time)  Medications Ordered in ED Medications  pantoprazole (PROTONIX) injection 40 mg (40 mg Intravenous Given 01/30/17 1659)     Initial Impression / Assessment and Plan / ED Course  I have reviewed the triage vital signs and the nursing notes.  Pertinent labs & imaging results that were available during my care of the patient were reviewed by me and considered in my medical decision making (see chart for details).     Final Clinical Impressions(s) / ED Diagnoses   Final diagnoses:  Acute GI bleeding  Lower GI bleed   Patient presents on above with lower GI bleed. She is anticoagulated  with Xarelto. She does have melanotic stool at this time. Patient is at risk for ongoing GI bleed. Plan will be for admission and observation. Patient's care is predominantly provided at [facility]. Arrangements made for transfer. New Prescriptions Discharge Medication List as of 01/30/2017  6:30 PM       Arby Barrette, MD 02/08/17 1319

## 2017-02-17 IMAGING — US US EXTREM LOW*R* COMPLETE
1 series · 10 of 10 positions shown · non-contrast
Comparison: None.

CLINICAL DATA: Bruised knot on the medial distal right lower leg.
Patient is on Xarelto.

EXAM:
ULTRASOUND RIGHT LOWER EXTREMITY COMPLETE
TECHNIQUE: Ultrasound examination was performed including evaluation of the
muscles, tendons, joint, and adjacent soft tissues.

[Series 1: us extrem low*right* complete · 10 acquisitions, 10 frames shown]
[im 1/10]
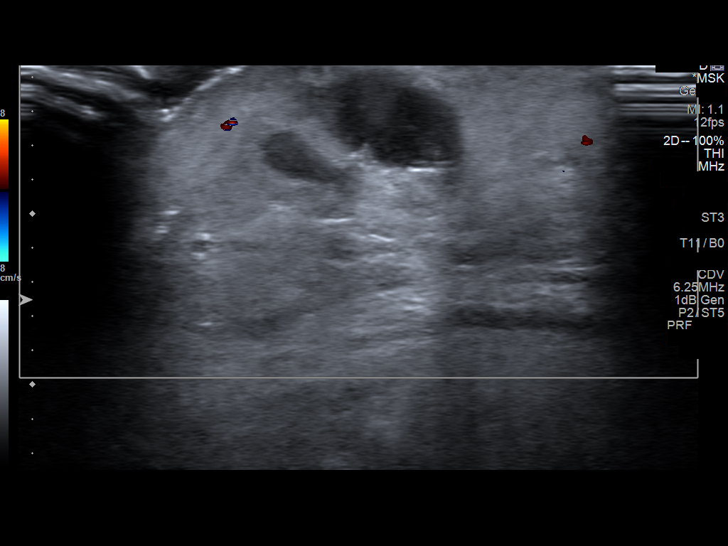
[im 2/10]
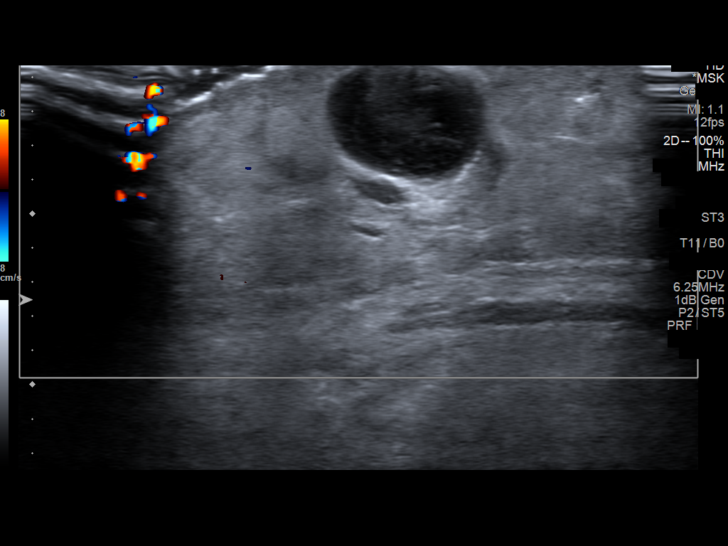
[im 3/10]
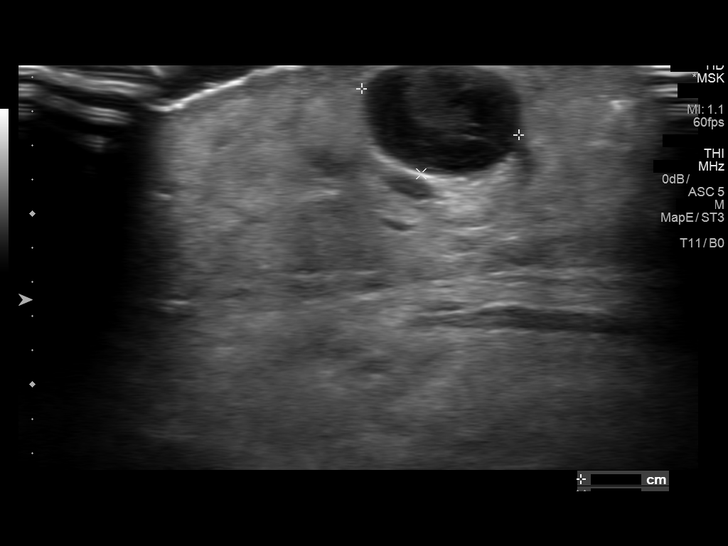
[im 4/10]
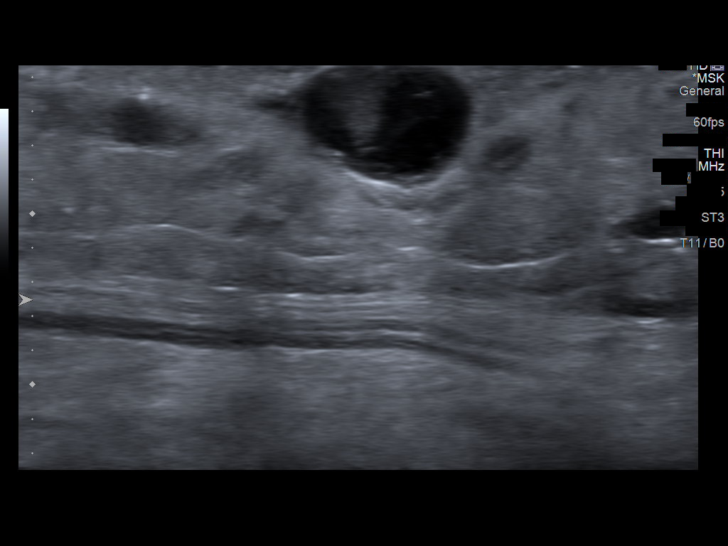
[im 5/10]
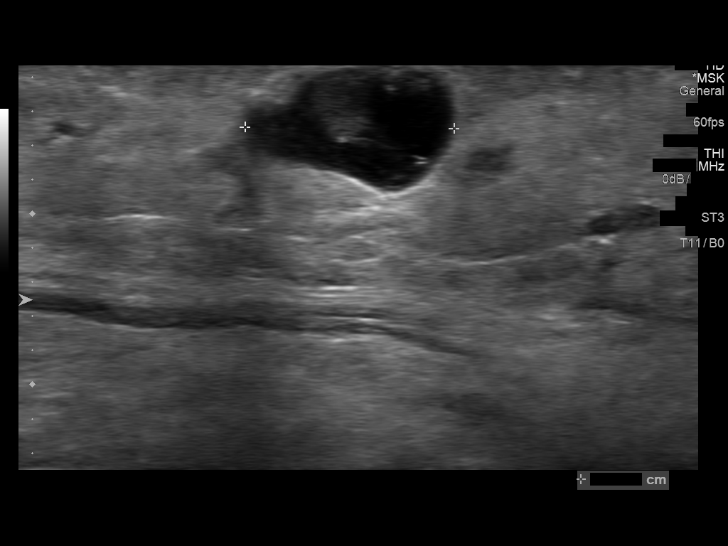
[im 6/10]
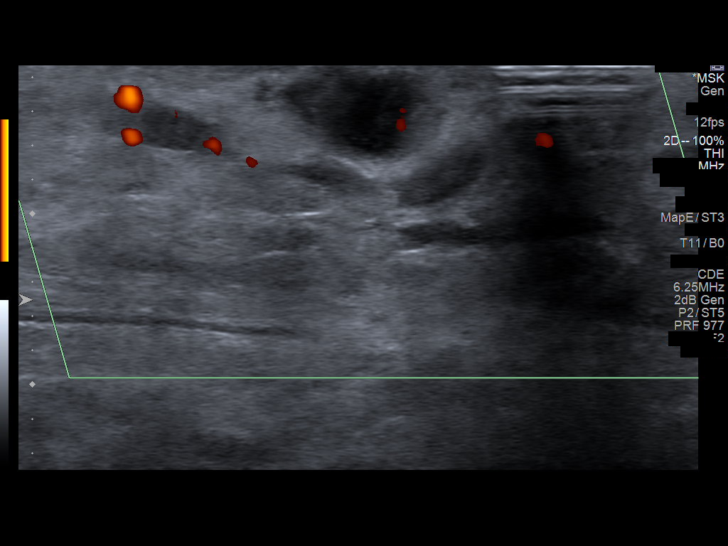
[im 7/10]
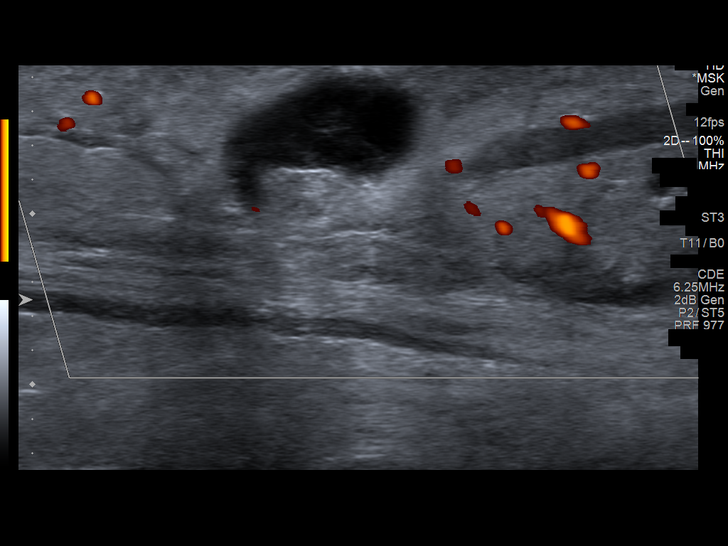
[im 8/10]
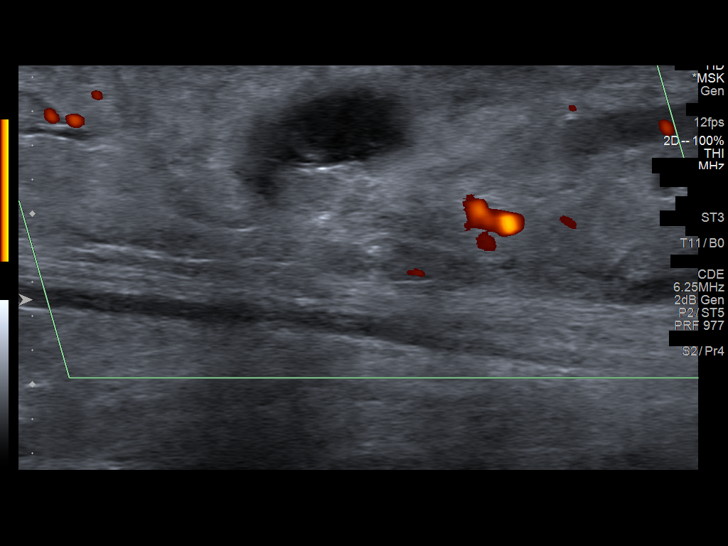
[im 9/10]
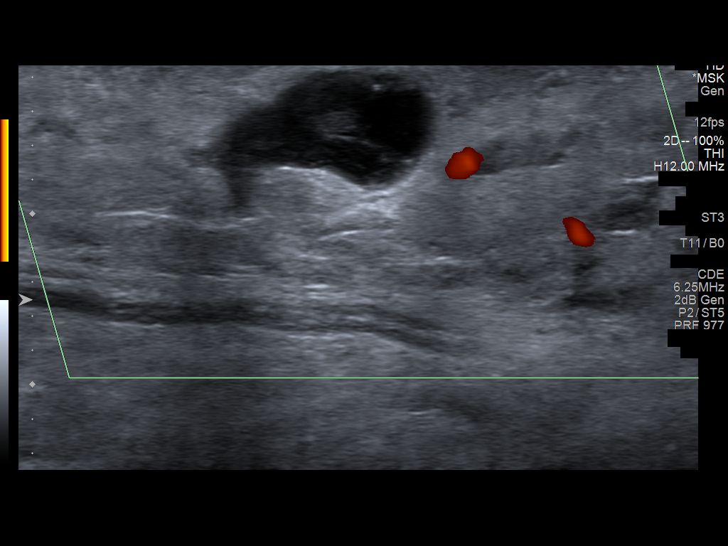
[im 10/10]
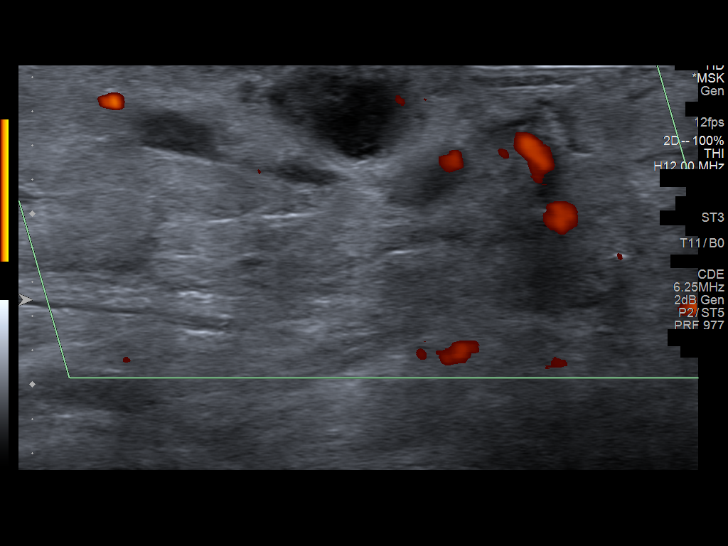

[10 of 10 positions shown; findings below may reference images not displayed]

FINDINGS: Targeted ultrasound is performed in the area of patient's concern.
In the area concern there is a small hypoechoic collection which
measures 1.2 x 0.7 x 1.0 cm. The surrounding tissues are
hyperechoic. The abnormality is in the superficial soft tissues of
the medial aspect of the distal right leg. No internal blood flow
was identified. Findings likely represent a hematoma and surrounding
edema.
IMPRESSION: Superficial 1.2 cm collection consistent with hematoma in the area
concern. Followup physical exam is recommended. If there is
persistent mass, followup ultrasound is recommended.

## 2017-06-11 DEATH — deceased

## 2017-06-28 IMAGING — CR DG CHEST 2V
2 series · 2 of 2 positions shown · non-contrast
Comparison: 06/02/2015

CLINICAL DATA: Cough, weakness, and shortness of breath for 1 week.

EXAM:
CHEST  2 VIEW

[w chest pa]
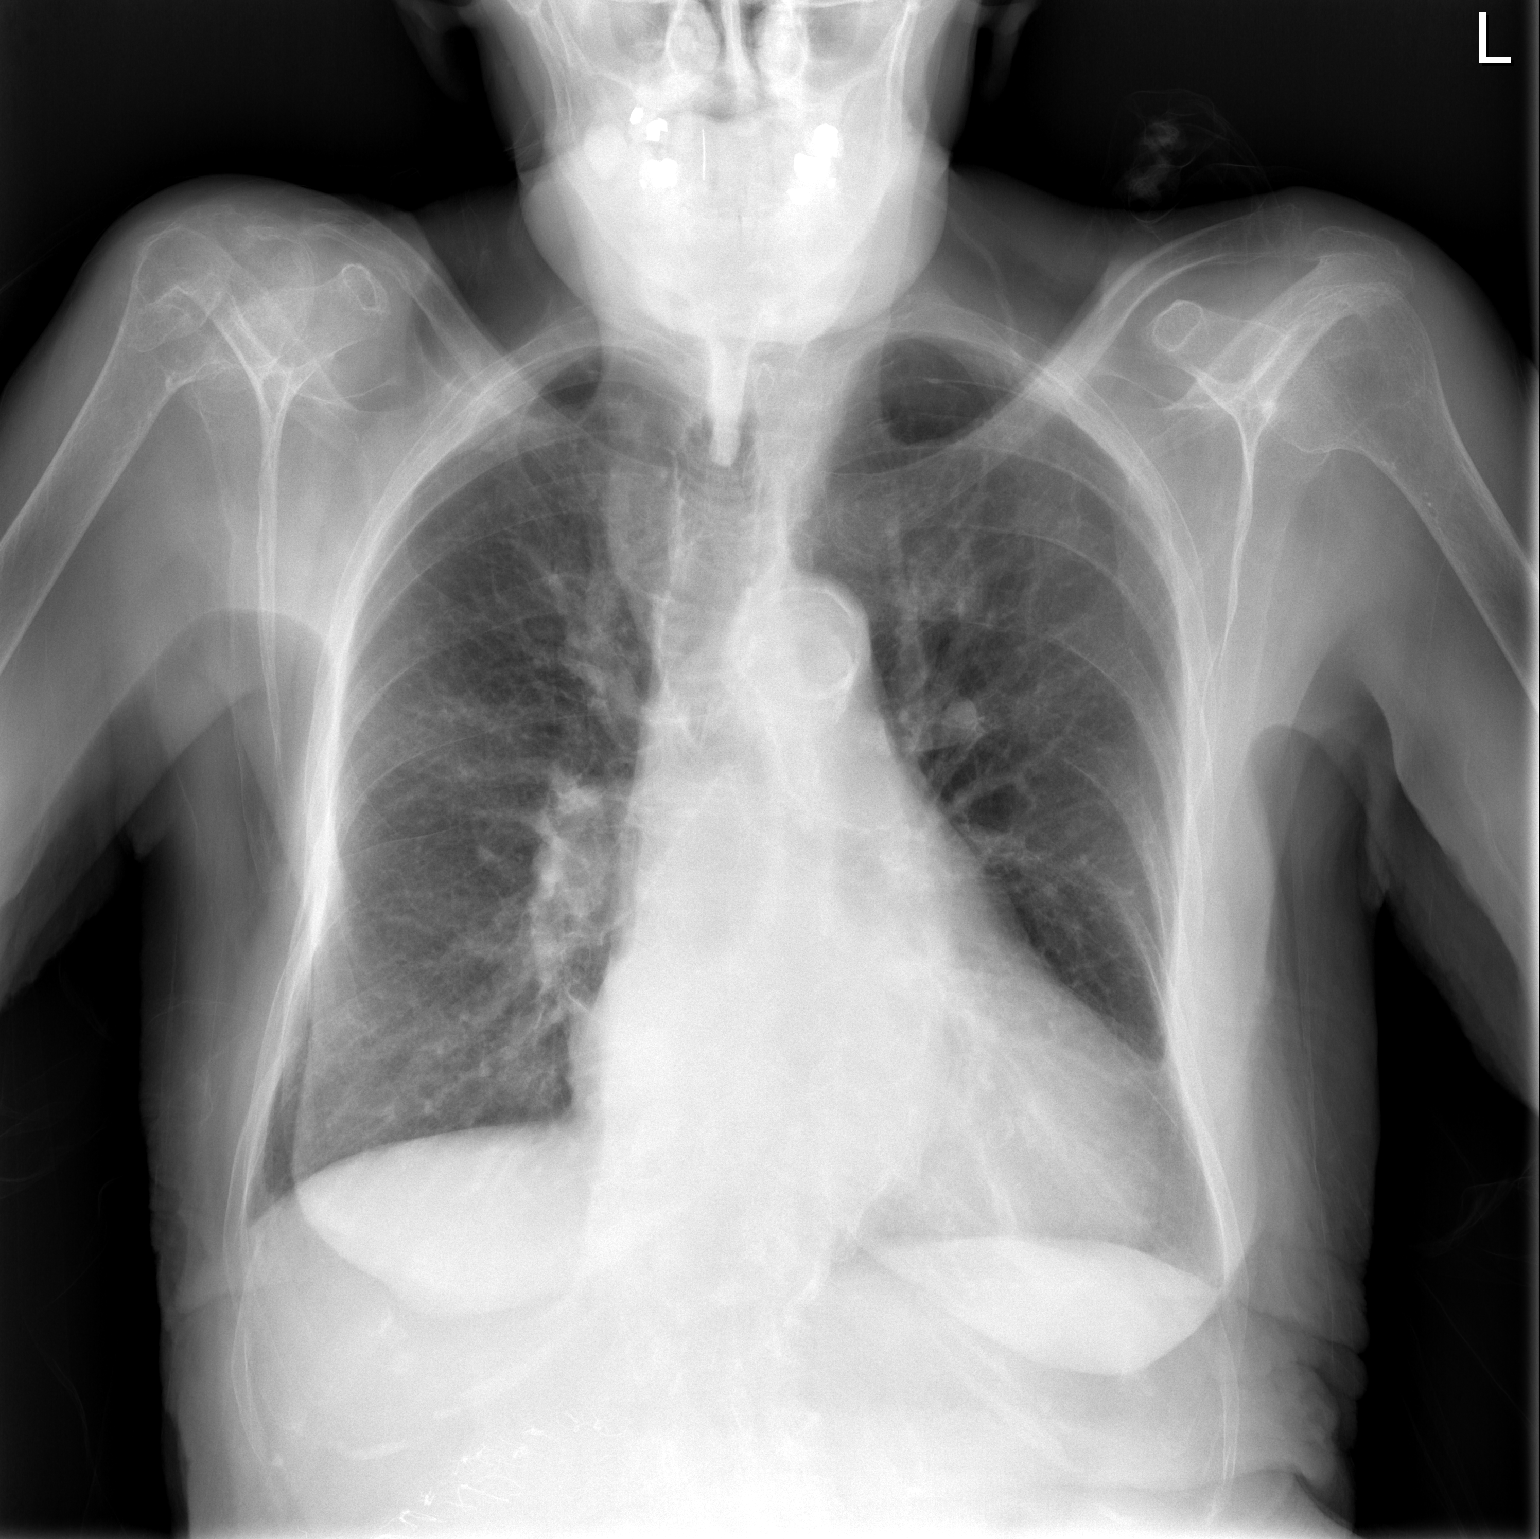

[w chest lat]
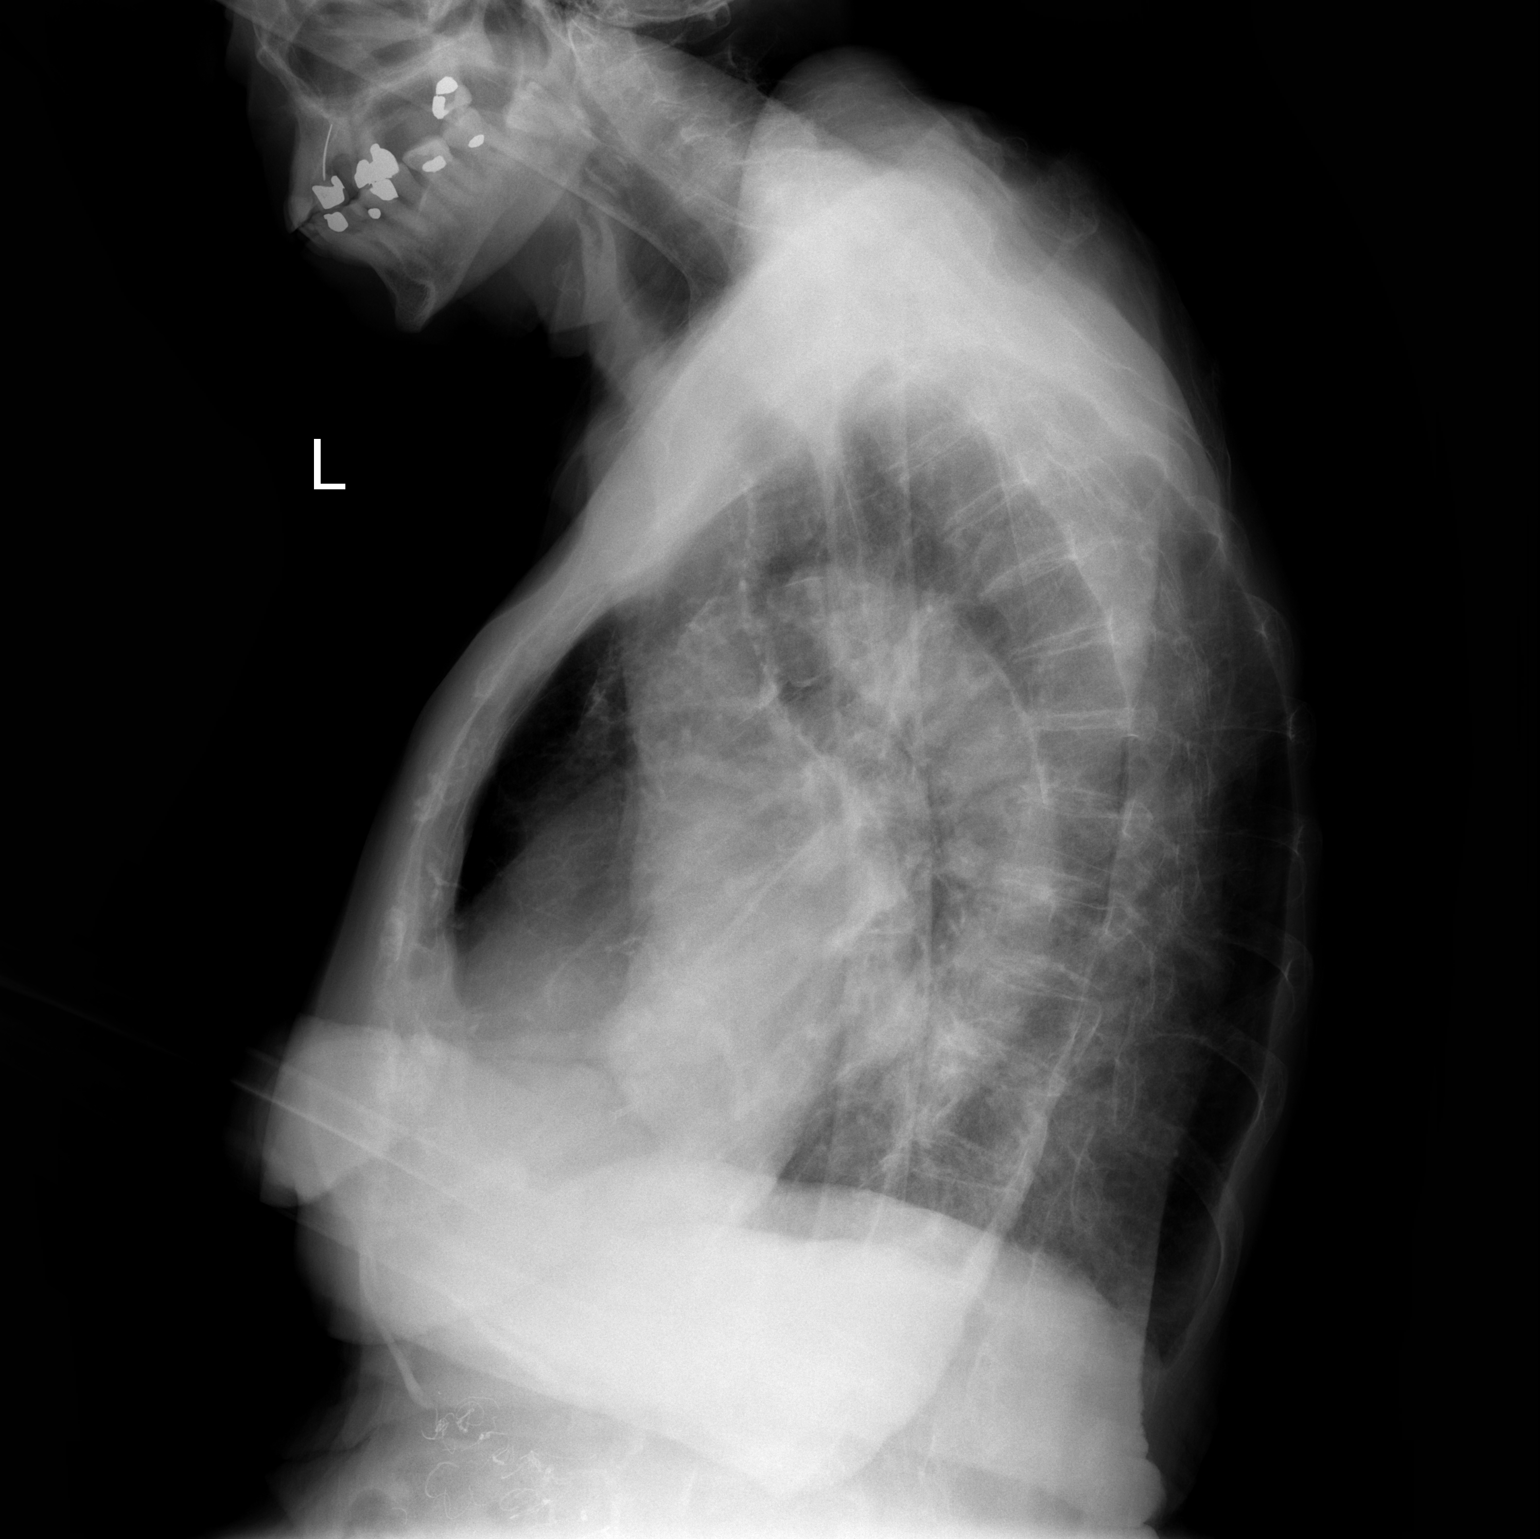

[2 of 2 positions shown; findings below may reference images not displayed]

FINDINGS: Mild cardiac enlargement. No pulmonary vascular congestion. No focal
airspace disease or consolidation in the lungs. Probable
emphysematous changes and scattered fibrosis in the lungs.
Peribronchial thickening suggesting chronic bronchitis. No blunting
of costophrenic angles. No pneumothorax. Calcified and tortuous
aorta. Degenerative changes throughout the thoracic spine with
prominent degenerative change in the right shoulder. Old resection
or resorption of the distal right clavicle.
IMPRESSION: Cardiac enlargement. Emphysematous changes and chronic bronchitic
changes in the lungs. No evidence of active pulmonary disease.

## 2018-09-02 ENCOUNTER — Telehealth: Payer: Self-pay

## 2018-09-02 NOTE — Telephone Encounter (Signed)
  Follow up Call-  No flowsheet data found.   Patient questions:  Do you have a fever, pain , or abdominal swelling? Yes.   Pain Score  0 *  Have you tolerated food without any problems? Yes.    Have you been able to return to your normal activities? Yes.    Do you have any questions about your discharge instructions: Diet   No. Medications  No. Follow up visit  No.  Do you have questions or concerns about your Care? No.  Actions: * If pain score is 4 or above: No action needed, pain <4.
